# Patient Record
Sex: Female | Born: 1985 | Hispanic: Yes | Marital: Married | Smoking: Never smoker
Health system: Southern US, Community
[De-identification: ages and names within clinical notes are randomized; demographics above are authoritative.]

## PROBLEM LIST (undated history)

## (undated) DIAGNOSIS — Z789 Other specified health status: Secondary | ICD-10-CM

## (undated) HISTORY — PX: NO PAST SURGERIES: SHX2092

---

## 2008-12-08 ENCOUNTER — Emergency Department (HOSPITAL_COMMUNITY): Admission: EM | Admit: 2008-12-08 | Discharge: 2008-12-08 | Payer: Self-pay | Admitting: Family Medicine

## 2010-02-12 ENCOUNTER — Inpatient Hospital Stay (HOSPITAL_COMMUNITY)
Admission: AD | Admit: 2010-02-12 | Discharge: 2010-02-14 | DRG: 775 | Disposition: A | Discharge status: Home / Self Care | Payer: Medicaid Other | Source: Ambulatory Visit | Attending: Obstetrics & Gynecology | Admitting: Obstetrics & Gynecology

## 2010-02-12 DIAGNOSIS — O99892 Other specified diseases and conditions complicating childbirth: Secondary | ICD-10-CM | POA: Diagnosis present

## 2010-02-12 DIAGNOSIS — Z22330 Carrier of Group B streptococcus: Secondary | ICD-10-CM

## 2010-02-12 LAB — CBC
HCT: 36.4 % (ref 36.0–46.0)
Hemoglobin: 11.8 g/dL — ABNORMAL LOW (ref 12.0–15.0)
MCV: 84.8 fL (ref 78.0–100.0)
RBC: 4.29 MIL/uL (ref 3.87–5.11)
WBC: 10.4 10*3/uL (ref 4.0–10.5)

## 2010-02-13 LAB — CBC
Hemoglobin: 10.3 g/dL — ABNORMAL LOW (ref 12.0–15.0)
MCH: 27.5 pg (ref 26.0–34.0)
MCV: 84.8 fL (ref 78.0–100.0)
Platelets: 171 10*3/uL (ref 150–400)
RBC: 3.75 MIL/uL — ABNORMAL LOW (ref 3.87–5.11)
WBC: 13 10*3/uL — ABNORMAL HIGH (ref 4.0–10.5)

## 2010-04-10 LAB — POCT URINALYSIS DIP (DEVICE)
Hgb urine dipstick: NEGATIVE
Ketones, ur: NEGATIVE mg/dL
Protein, ur: NEGATIVE mg/dL
Specific Gravity, Urine: 1.01 (ref 1.005–1.030)
Urobilinogen, UA: 1 mg/dL (ref 0.0–1.0)

## 2010-04-10 LAB — POCT PREGNANCY, URINE: Preg Test, Ur: NEGATIVE

## 2011-03-07 ENCOUNTER — Emergency Department (HOSPITAL_COMMUNITY)
Admission: EM | Admit: 2011-03-07 | Discharge: 2011-03-07 | Disposition: A | Discharge status: Home / Self Care | Payer: Self-pay | Source: Home / Self Care | Attending: Family Medicine | Admitting: Family Medicine

## 2011-03-07 ENCOUNTER — Encounter (HOSPITAL_COMMUNITY): Payer: Self-pay | Admitting: *Deleted

## 2011-03-07 DIAGNOSIS — N61 Mastitis without abscess: Secondary | ICD-10-CM

## 2011-03-07 MED ORDER — CEPHALEXIN 500 MG PO CAPS: 500.0000 mg | ORAL_CAPSULE | Freq: Four times a day (QID) | ORAL | Status: AC

## 2011-03-07 NOTE — ED Provider Notes (Signed)
History     CSN: [id number]  Arrival date & time 03/07/11  1357   First MD Initiated Contact with Patient 03/07/11 1417      Chief Complaint  Patient presents with  . Fever    (Consider location/radiation/quality/duration/timing/severity/associated sxs/prior treatment) HPI Comments: The patient reports having left breast redness and pain along with fever x 2 days. She is breast feeding. Fever has been low grad at most. Denies cough or cold symptoms. No uti symptoms. No n/v/d. Has applied warm compresses with relief.   The history is provided by the patient.    History reviewed. No pertinent past medical history.  History reviewed. No pertinent past surgical history.  History reviewed. No pertinent family history.  History  Substance Use Topics  . Smoking status: Not on file  . Smokeless tobacco: Not on file  . Alcohol Use: Not on file    OB History    Grav Para Term Preterm Abortions TAB SAB Ect Mult Living                  Review of Systems  Constitutional: Positive for fever.  HENT: Negative.   Respiratory: Negative.   Cardiovascular: Negative.   Gastrointestinal: Negative.   Genitourinary: Negative.   Musculoskeletal: Negative.     Allergies  Review of patient's allergies indicates not on file.  Home Medications   Current Outpatient Rx  Name Route Sig Dispense Refill  . CEPHALEXIN 500 MG PO CAPS Oral Take 1 capsule (500 mg total) by mouth 4 (four) times daily. 28 capsule 0    BP 99/65  Pulse 70  Temp(Src) 99.1 F (37.3 C) (Oral)  Resp 16  SpO2 98%  LMP 03/05/2011  Physical Exam  Nursing note and vitals reviewed. Constitutional: She appears well-developed and well-nourished. No distress.  Cardiovascular: Normal rate and regular rhythm.   Pulmonary/Chest: Effort normal and breath sounds normal.       With female nursing personal stasha present exam of the the left breast revealed redness , increased warmth and tenderness superior left breast  approx 4 cm in diameter. No induration or flutulence.     ED Course  Procedures (including critical care time)  Labs Reviewed - No data to display No results found.   1. Mastitis       MDM          Randa Spike, MD 03/07/11 [phone]

## 2011-03-07 NOTE — ED Notes (Signed)
Checked on pt  .adjusted bed for comfort. Asked if needed anything to drink or a blanket. She stated no thank you just waiting for discharge papers

## 2011-03-07 NOTE — ED Notes (Signed)
Pt    Reports     Feve Body  Aches       Headache       l  Breast   Swollen     And      Painfull        Since     Yesterday  Pt  Is  Breast  Feeding

## 2011-03-07 NOTE — Discharge Instructions (Signed)
Continue to breast feed . Apply warm compresses as discussed. Follow up  If symptoms worsen. May take tylenol or motrin for fever.

## 2011-03-19 ENCOUNTER — Emergency Department (HOSPITAL_COMMUNITY)
Admission: EM | Admit: 2011-03-19 | Discharge: 2011-03-19 | Disposition: A | Discharge status: Home / Self Care | Payer: Self-pay | Source: Home / Self Care

## 2011-03-19 ENCOUNTER — Encounter (HOSPITAL_COMMUNITY): Payer: Self-pay | Admitting: Emergency Medicine

## 2011-03-19 DIAGNOSIS — L42 Pityriasis rosea: Secondary | ICD-10-CM

## 2011-03-19 NOTE — Discharge Instructions (Signed)
Pityriasis Rosea Pityriasis rosea is a rash which is probably caused by a virus. It generally starts as a scaly, red patch on the trunk (the area of the body that a t-shirt would cover) but does not appear on sun exposed areas. The rash is usually preceded by an initial larger spot called the "herald patch" a week or more before the rest of the rash appears. Generally within one to two days the rash appears rapidly on the trunk, upper arms, and sometimes the upper legs. The rash usually appears as flat, oval patches of scaly pink color. The rash can also be raised and one is able to feel it with a finger. The rash can also be finely crinkled and may slough off leaving a ring of scale around the spot. Sometimes a mild sore throat is present with the rash. It usually affects children and young adults in the spring and autumn. Women are more frequently affected than men. TREATMENT  Pityriasis rosea is a self-limited condition. This means it goes away within 4 to 8 weeks without treatment. The spots may persist for several months, especially in darker-colored skin after the rash has resolved and healed. Benadryl and steroid creams may be used if itching is a problem. SEEK MEDICAL CARE IF:   Your rash does not go away or persists longer than three months.   You develop fever and joint pain.   You develop severe headache and confusion.   You develop breathing difficulty, vomiting and/or extreme weakness.  Document Released: 01/30/2001 Document Revised: 12/13/2010 Document Reviewed: 02/19/2008 [facility] Patient Information 2012 [place], [place].Pityriasis Rosea Pityriasis rosea is a rash which is probably caused by a virus. It generally starts as a scaly, red patch on the trunk (the area of the body that a t-shirt would cover) but does not appear on sun exposed areas. The rash is usually preceded by an initial larger spot called the "herald patch" a week or more before the rest of the rash appears. Generally  within one to two days the rash appears rapidly on the trunk, upper arms, and sometimes the upper legs. The rash usually appears as flat, oval patches of scaly pink color. The rash can also be raised and one is able to feel it with a finger. The rash can also be finely crinkled and may slough off leaving a ring of scale around the spot. Sometimes a mild sore throat is present with the rash. It usually affects children and young adults in the spring and autumn. Women are more frequently affected than men. TREATMENT  Pityriasis rosea is a self-limited condition. This means it goes away within 4 to 8 weeks without treatment. The spots may persist for several months, especially in darker-colored skin after the rash has resolved and healed. Benadryl and steroid creams may be used if itching is a problem. SEEK MEDICAL CARE IF:   Your rash does not go away or persists longer than three months.   You develop fever and joint pain.   You develop severe headache and confusion.   You develop breathing difficulty, vomiting and/or extreme weakness.  Document Released: 01/30/2001 Document Revised: 12/13/2010 Document Reviewed: 02/19/2008 [facility] Patient Information 2012 [place], [place].

## 2011-03-19 NOTE — ED Provider Notes (Signed)
History     CSN: [id number]  Arrival date & time 03/19/11  1404   None     Chief Complaint  Patient presents with  . Rash    (Consider location/radiation/quality/duration/timing/severity/associated sxs/prior treatment) HPI Comments: Patient presents with c/o a rash. The rash began 4 days ago on her Rt forearm. That evening she noticed it also in her groin. It has spread since then. No itching or discomfort. She denies new foods, soaps, detergents, etc. She took 2 days of an antibiotic 2 weeks ago for mastitis Lt breast. She has fever and erythematous area of her breast. She states the symptoms resolved. No sneezing, watery eyes, or URI symptoms. No one else in the home with rash symptoms. She has not tried anything for the rash.    History reviewed. No pertinent past medical history.  History reviewed. No pertinent past surgical history.  History reviewed. No pertinent family history.  History  Substance Use Topics  . Smoking status: Not on file  . Smokeless tobacco: Not on file  . Alcohol Use: Not on file    OB History    Grav Para Term Preterm Abortions TAB SAB Ect Mult Living                  Review of Systems  Constitutional: Negative for fever, chills and fatigue.  HENT: Negative for ear pain, congestion, sore throat and rhinorrhea.   Respiratory: Negative for cough.   Cardiovascular: Negative for chest pain.  Skin: Positive for rash.    Allergies  Review of patient's allergies indicates no known allergies.  Home Medications  No current outpatient prescriptions on file.  BP 105/67  Pulse 82  Temp(Src) 97.4 F (36.3 C) (Oral)  Resp 16  SpO2 97%  LMP 03/05/2011  Physical Exam  Nursing note and vitals reviewed. Constitutional: She appears well-developed and well-nourished. No distress.  HENT:  Head: Normocephalic and atraumatic.  Right Ear: Tympanic membrane, external ear and ear canal normal.  Left Ear: Tympanic membrane, external ear and ear canal  normal.  Nose: Nose normal.  Mouth/Throat: Uvula is midline, oropharynx is clear and moist and mucous membranes are normal. No oropharyngeal exudate, posterior oropharyngeal edema or posterior oropharyngeal erythema.  Neck: Neck supple.  Cardiovascular: Normal rate, regular rhythm and normal heart sounds.   Pulmonary/Chest: Effort normal and breath sounds normal. No respiratory distress.  Lymphadenopathy:    She has no cervical adenopathy.  Neurological: She is alert.  Skin: Skin is warm and dry. Rash noted.       Scattered on trunk and bilat UE erythematous papules and oval shaped salmon colored lesions all < 1 cm in diameter. Some with greyish centers and some are dry and centrally peeling. No vesicles or pustules. Pt has increased lesions in groin and all of these lesions are centrally peeling.   Psychiatric: She has a normal mood and affect.    ED Course  Procedures (including critical care time)  Labs Reviewed - No data to display No results found.   1. Pityriasis rosea       MDM  Typical appearance of Pityriasis rosea, though no heard patch.         Melody Comas, [place] 03/19/11 [phone]

## 2011-03-19 NOTE — ED Notes (Signed)
Patient reports red areas, the size of an " eraser tip" on trunk, extremities.  Scattered, non specific pattern to rash.  Areas do not itch, do not hurt.  Areas do continue to spread

## 2011-03-20 NOTE — ED Provider Notes (Signed)
Medical screening examination/treatment/procedure(s) were performed by non-physician practitioner and as supervising physician I was immediately available for consultation/collaboration.  LANEY,RONNIE   Aaiden Depoy B Laney, MD 03/20/11 1630 

## 2012-03-27 ENCOUNTER — Encounter (HOSPITAL_COMMUNITY): Payer: Self-pay

## 2012-03-27 ENCOUNTER — Emergency Department (HOSPITAL_COMMUNITY)
Admission: EM | Admit: 2012-03-27 | Discharge: 2012-03-27 | Disposition: A | Discharge status: Home / Self Care | Payer: Self-pay | Attending: Emergency Medicine | Admitting: Emergency Medicine

## 2012-03-27 DIAGNOSIS — Z23 Encounter for immunization: Secondary | ICD-10-CM | POA: Insufficient documentation

## 2012-03-27 DIAGNOSIS — IMO0002 Reserved for concepts with insufficient information to code with codable children: Secondary | ICD-10-CM | POA: Insufficient documentation

## 2012-03-27 DIAGNOSIS — S8010XA Contusion of unspecified lower leg, initial encounter: Secondary | ICD-10-CM | POA: Insufficient documentation

## 2012-03-27 DIAGNOSIS — Y929 Unspecified place or not applicable: Secondary | ICD-10-CM | POA: Insufficient documentation

## 2012-03-27 DIAGNOSIS — W540XXA Bitten by dog, initial encounter: Secondary | ICD-10-CM | POA: Insufficient documentation

## 2012-03-27 DIAGNOSIS — S81852A Open bite, left lower leg, initial encounter: Secondary | ICD-10-CM

## 2012-03-27 DIAGNOSIS — Y9389 Activity, other specified: Secondary | ICD-10-CM | POA: Insufficient documentation

## 2012-03-27 DIAGNOSIS — R51 Headache: Secondary | ICD-10-CM

## 2012-03-27 DIAGNOSIS — M7989 Other specified soft tissue disorders: Secondary | ICD-10-CM | POA: Insufficient documentation

## 2012-03-27 DIAGNOSIS — R42 Dizziness and giddiness: Secondary | ICD-10-CM | POA: Insufficient documentation

## 2012-03-27 MED ORDER — AMOXICILLIN-POT CLAVULANATE 875-125 MG PO TABS: 1.0000 | ORAL_TABLET | Freq: Two times a day (BID) | ORAL | Status: DC

## 2012-03-27 MED ORDER — TETANUS-DIPHTH-ACELL PERTUSSIS 5-2.5-18.5 LF-MCG/0.5 IM SUSP
0.5000 mL | Freq: Once | INTRAMUSCULAR | Status: AC
  Administered 2012-03-27: 0.5 mL via INTRAMUSCULAR
  Filled 2012-03-27: qty 0.5

## 2012-03-27 NOTE — ED Notes (Signed)
Patient is alert and orientedx4.  Patient was explained discharge instructions and they understood them with no questions.  Instructions were provided in Spanish.

## 2012-03-27 NOTE — ED Notes (Signed)
Pt presents with increased pain to L lower leg after dog bite x 4 days ago.  Pt reports 3 puncture wounds to L calf, reports warmth to area, denies any drainage.  Pt reports onset of headache that began today to bilateral temporal area.  Pt reports taking tylenol that helped relief headache.  +nausea

## 2012-03-27 NOTE — ED Provider Notes (Signed)
Medical screening examination/treatment/procedure(s) were performed by non-physician practitioner and as supervising physician I was immediately available for consultation/collaboration.   Nelia Shi, MD 03/27/12 [phone]

## 2012-03-27 NOTE — ED Provider Notes (Signed)
History    This chart was scribed for non-physician practitioner working with Nelia Shi, MD by Donne Anon, ED Scribe. This patient was seen in room TR05C/TR05C and the patient's care was started at 1511.   CSN: [id number]  Arrival date & time 03/27/12  1311   First MD Initiated Contact with Patient 03/27/12 1511      No chief complaint on file.    The history is provided by the patient. No language interpreter was used.   Jeanne Munoz is a 27 y.o. female who presents to the Emergency Department complaining of sudden onset, constant, gradually worsening moderate lower left leg wound due to a dog bite which occurred 4 days ago. She denies seeing anyone for the wound. She reports it was her neighbor's dog, but she is unsure if it is UTD on its shots. She has put an antibiotic cream on the wound. She reports associated swelling and heat to her shin. She also reports associated gradual onset, constant, mild HA. She denies fever, chills, or any other pain. She has tried Advil and Tylenol with little relief.     She is unsure if her tetanus shot is UTD.  History reviewed. No pertinent past medical history.  History reviewed. No pertinent past surgical history.  History reviewed. No pertinent family history.  History  Substance Use Topics  . Smoking status: Never Smoker   . Smokeless tobacco: Not on file  . Alcohol Use: No    OB History   Grav Para Term Preterm Abortions TAB SAB Ect Mult Living                  Review of Systems  Constitutional: Negative for fever and chills.  Skin: Positive for wound.  Neurological: Positive for headaches.  All other systems reviewed and are negative.    Allergies  Review of patient's allergies indicates no known allergies.  Home Medications   Current Outpatient Rx  Name  Route  Sig  Dispense  Refill  . acetaminophen (TYLENOL) 500 MG tablet   Oral   Take 1,000 mg by mouth every 6 (six) hours as needed for pain.          Marland Kitchen ibuprofen (ADVIL,MOTRIN) 200 MG tablet   Oral   Take 200 mg by mouth every 6 (six) hours as needed for pain or headache.           BP 110/66  Pulse 77  Temp(Src) 98.9 F (37.2 C) (Oral)  Resp 18  SpO2 99%  LMP 03/02/2012  Physical Exam  Nursing note and vitals reviewed. Constitutional: She is oriented to person, place, and time. She appears well-developed and well-nourished. No distress.  HENT:  Head: Normocephalic and atraumatic.  Eyes: EOM are normal.  Neck: Neck supple. No tracheal deviation present.  Cardiovascular: Normal rate and intact distal pulses.   Pulmonary/Chest: Effort normal. No respiratory distress.  Musculoskeletal: Normal range of motion.  Neurological: She is alert and oriented to person, place, and time.  Skin: Skin is warm and dry.  10 cm x 8 cm contusion to outside left lower lateral shin. Superficial healing abrasion to the leg. Induration under the abrasions. No erythema. Area is tender to palpation. Distal pulses normal. No signs of compartment syndrome. Full rom of the knee and ankle joints. Good strength with dorsiflexion nd plantarflexion of the foot.   Psychiatric: She has a normal mood and affect. Her behavior is normal.    ED Course  Procedures (including critical care  time) DIAGNOSTIC STUDIES: Oxygen Saturation is 99% on room air, normal by my interpretation.    COORDINATION OF CARE: 3:20 PM Discussed treatment plan which includes antibiotics, rest and elevation of her leg with pt at bedside and pt agreed to plan. Also advised pt to check with owner if dog's shots are UTD.    Labs Reviewed - No data to display No results found.   1. Dog bite of lower leg, left, initial encounter   2. Headache       MDM  Pt with dog bite 4 days ago. Healing. Some tenderness and induration over the bite. Contusiont. Will start on augmentin. Will need to report to animal control and make sure dog's rabies is up to date. Tetanus updated here. At  present no signs of major cellulitis or soft tissue abscess. No compartment syndrome.    I personally performed the services described in this documentation, which was scribed in my presence. The recorded information has been reviewed and is accurate.     RENE GONSOULIN, PA-C 03/27/12 1535  CARLEA BADOUR, PA-C 03/27/12 1551

## 2013-04-01 ENCOUNTER — Emergency Department (INDEPENDENT_AMBULATORY_CARE_PROVIDER_SITE_OTHER)
Admission: EM | Admit: 2013-04-01 | Discharge: 2013-04-01 | Disposition: A | Discharge status: Home / Self Care | Payer: Self-pay | Source: Home / Self Care | Attending: Family Medicine | Admitting: Family Medicine

## 2013-04-01 ENCOUNTER — Encounter (HOSPITAL_COMMUNITY): Payer: Self-pay | Admitting: Emergency Medicine

## 2013-04-01 DIAGNOSIS — M6740 Ganglion, unspecified site: Secondary | ICD-10-CM

## 2013-04-01 NOTE — ED Provider Notes (Signed)
Medical screening examination/treatment/procedure(s) were performed by a resident physician or non-physician practitioner and as the supervising physician I was immediately available for consultation/collaboration.  Evan Corey, MD    Evan S Corey, MD 04/01/13 1812 

## 2013-04-01 NOTE — ED Provider Notes (Signed)
CSN: [id number]     Arrival date & time 04/01/13  1446 History   First MD Initiated Contact with Patient 04/01/13 1704     Chief Complaint  Patient presents with  . Cyst   (Consider location/radiation/quality/duration/timing/severity/associated sxs/prior Treatment) HPI Comments: Minimal pain unless wearing shoes that press on knot on top of foot.   Patient is a 28 y.o. female presenting with lower extremity pain. The history is provided by the patient.  Foot Pain This is a new problem. Episode onset: 3 weeks ago. The problem occurs constantly. The problem has not changed since onset.Exacerbated by: wearing shoes. Nothing relieves the symptoms. She has tried nothing for the symptoms.    History reviewed. No pertinent past medical history. History reviewed. No pertinent past surgical history. History reviewed. No pertinent family history. History  Substance Use Topics  . Smoking status: Never Smoker   . Smokeless tobacco: Not on file  . Alcohol Use: No   OB History   Grav Para Term Preterm Abortions TAB SAB Ect Mult Living                 Review of Systems  Constitutional: Negative for fever and chills.  Musculoskeletal:       Knot on top of foot  Skin: Negative for color change and wound.  Neurological: Negative for numbness.    Allergies  Review of patient's allergies indicates no known allergies.  Home Medications   Current Outpatient Rx  Name  Route  Sig  Dispense  Refill  . acetaminophen (TYLENOL) 500 MG tablet   Oral   Take 1,000 mg by mouth every 6 (six) hours as needed for pain.         Marland Kitchen. amoxicillin-clavulanate (AUGMENTIN) 875-125 MG per tablet   Oral   Take 1 tablet by mouth 2 (two) times daily.   10 tablet   0   . ibuprofen (ADVIL,MOTRIN) 200 MG tablet   Oral   Take 200 mg by mouth every 6 (six) hours as needed for pain or headache.          BP 109/61  Pulse 63  Temp(Src) 98 F (36.7 C) (Oral)  Resp 12  SpO2 100% Physical Exam    Constitutional: She appears well-developed and well-nourished. No distress.  Musculoskeletal:       Right foot: She exhibits normal range of motion, no tenderness and no bony tenderness.       Feet:    ED Course  Procedures (including critical care time) Labs Review Labs Reviewed - No data to display Imaging Review No results found.   MDM   1. Ganglion cyst   Dr. Denyse Amassorey discussed tx options with pt and offered injection. Pt declines at this time but knows she can return in the future on a day Dr. Denyse Amassorey works for injection.      Cathlyn ParsonsAngela M Herald Vallin, NP 04/01/13 [phone]

## 2013-04-01 NOTE — Discharge Instructions (Signed)
If you decide to have the injection, come back to Urgent Care on a day Dr. Denyse Amassorey is working.  April 14 from 5-8pm April 16 from 8am-8pm April 17 from 8am-8pm April 18 from 9am-7pm  It is best to come in the morning.    Ganglion Cyst A ganglion cyst is a noncancerous, fluid-filled lump that occurs near joints or tendons. The ganglion cyst grows out of a joint or the lining of a tendon. It most often develops in the hand or wrist but can also develop in the shoulder, elbow, hip, knee, ankle, or foot. The round or oval ganglion can be pea sized or larger than a grape. Increased activity may enlarge the size of the cyst because more fluid starts to build up.  CAUSES  It is not completely known what causes a ganglion cyst to grow. However, it may be related to:  Inflammation or irritation around the joint.  An injury.  Repetitive movements or overuse.  Arthritis. SYMPTOMS  A lump most often appears in the hand or wrist, but can occur in other areas of the body. Generally, the lump is painless without other symptoms. However, sometimes pain can be felt during activity or when pressure is applied to the lump. The lump may even be tender to the touch. Tingling, pain, numbness, or muscle weakness can occur if the ganglion cyst presses on a nerve. Your grip may be weak and you may have less movement in your joints.  DIAGNOSIS  Ganglion cysts are most often diagnosed based on a physical exam, noting where the cyst is and how it looks. Your caregiver will feel the lump and may shine a light alongside it. If it is a ganglion, a light often shines through it. Your caregiver may order an X-ray, ultrasound, or MRI to rule out other conditions. TREATMENT  Ganglions usually go away on their own without treatment. If pain or other symptoms are involved, treatment may be needed. Treatment is also needed if the ganglion limits your movement or if it gets infected. Treatment options include:  Wearing a wrist  or finger brace or splint.  Taking anti-inflammatory medicine.  Draining fluid from the lump with a needle (aspiration).  Injecting a steroid into the joint.  Surgery to remove the ganglion cyst and its stalk that is attached to the joint or tendon. However, ganglion cysts can grow back. HOME CARE INSTRUCTIONS   Do not press on the ganglion, poke it with a needle, or hit it with a heavy object. You may rub the lump gently and often. Sometimes fluid moves out of the cyst.  Only take medicines as directed by your caregiver.  Wear your brace or splint as directed by your caregiver. SEEK MEDICAL CARE IF:   Your ganglion becomes larger or more painful.  You have increased redness, red streaks, or swelling.  You have pus coming from the lump.  You have weakness or numbness in the affected area. MAKE SURE YOU:   Understand these instructions.  Will watch your condition.  Will get help right away if you are not doing well or get worse. Document Released: 12/22/1999 Document Revised: 09/18/2011 Document Reviewed: 02/17/2007 [facility] Patient Information 2014 [employer], [place].

## 2013-04-01 NOTE — ED Notes (Signed)
Knot on the top of right foot.  Knot is firm and some tenderness involved with palpation.  Otherwise does not hurt.  Noted knot 3 weeks ago, no smaller, no bigger.  No known injury.

## 2013-05-07 ENCOUNTER — Emergency Department (INDEPENDENT_AMBULATORY_CARE_PROVIDER_SITE_OTHER)
Admission: EM | Admit: 2013-05-07 | Discharge: 2013-05-07 | Disposition: A | Discharge status: Home / Self Care | Payer: Self-pay | Source: Home / Self Care | Attending: Family Medicine | Admitting: Family Medicine

## 2013-05-07 ENCOUNTER — Encounter (HOSPITAL_COMMUNITY): Payer: Self-pay | Admitting: Emergency Medicine

## 2013-05-07 DIAGNOSIS — M6740 Ganglion, unspecified site: Secondary | ICD-10-CM

## 2013-05-07 MED ORDER — METHYLPREDNISOLONE ACETATE 40 MG/ML IJ SUSP
INTRAMUSCULAR | Status: AC
  Filled 2013-05-07: qty 5

## 2013-05-07 NOTE — Discharge Instructions (Signed)
Gracias por venir hoy.  Llame o vaya a la sala de emergencias si se presenta una gran articulacin hinchada roja con extremo dolor o supuracin de pus .  Quiste sinovial  (Ganglion Cyst)  Un quiste sinovial o ganglin es un bulto no canceroso, lleno de lquido que aparece cerca de las articulaciones o tendones. El quiste sinovial aparece sobre una articulacin o en el revestimiento de un tendn. Aparece con ms frecuencia en la mano o en la [place], pero tambin puede aparecer en el hombro, el codo, la cadera, la rodilla, el tobillo o el pie. El ganglin redondo u ovalado puede ser del tamao de un guisante o ms grande que un grano de [place]. El aumento de la actividad puede aumentar el tamao del quiste ya que se acumular ms lquido.  CAUSAS  No se conoce la causa de la formacin de un quiste sinovial. Sin embargo, puede estar relacionado con:   Inflamacin o irritacin alrededor de la articulacin.  Una lesin.  Movimientos repetitivos o el uso excesivo.  Artritis. SNTOMAS  Generalmente es un bulto que aparece en la mano o en la [place], [place] puede aparecer en otras zonas del cuerpo. Se trata de una masa indolora y sin otros sntomas. En algunos casos puede haber dolor durante la [place] o cuando se aplica presin [place] el quiste. Puede ser sensible al tacto. Puede causar hormigueo, dolor, entumecimiento o debilidad muscular si el ganglin presiona un nervio. Su agarre puede ser dbil y puede ser que tenga menos movimiento en la articulacin.  DIAGNSTICO  El diagnstico se realiza con el examen fsico, percibiendo donde se encuentra el quiste y su aspecto. El mdico palpar el bulto y lo estudiar aplicando una luz. Si se trata de un ganglin, la luz lo Engineer, agriculturalatravesar. El Chemical engineermdico le indicar radiografas, ecografas o una resonancia magntica para Architectural technologistdescartar otras patologas.  TRATAMIENTO  Generalmente desaparecen por s solos, sin tratamiento. Si siente dolor o tiene otros sntomas, puede ser  Firefighternecesario un tratamiento. Tambin ser necesario seguir un tratamiento si limita sus movimientos o si se infecta. Las opciones de tratamiento son:   El uso de un cabestrillo o frula en la mueca o los dedos.  Medicamentos antiinflamatorios.  Drenar el lquido con una aguja (aspiracin).  Inyeccin de corticoides en la articulacin.  Ciruga para extirpar el ganglio y el tallo que lo adhiere a la articulacin o al tendn. Sin embargo, los quistes sinoviales pueden volver a Research officer, trade unionaparecer. INSTRUCCIONES PARA EL CUIDADO EN EL HOGAR   No presione el ganglin, no lo pinche con una aguja ni lo golpee con un objeto pesado. Puede frotarlo suavemente y con frecuencia. En algunos casos podr salir lquido del quiste.  Tome slo la medicacin que le indic el profesional.  Use la frula o el cabestrillo segn las indicaciones de su mdico. SOLICITE ATENCIN MDICA SI:   El ganglin se agranda o se vuelve ms doloroso.  El enrojecimiento, la hinchazn o las lneas rojas [place]oaaumentan.  Observa que sale pus del bulto.  Siente debilidad o adormecimiento alrededor de la zona afectada. ASEGRESE DE QUE:   Comprende estas instrucciones.  Controlar su enfermedad.  Solicitar ayuda de inmediato si no mejora o si empeora. Document Released: 10/03/2004 Document Revised: 09/18/2011 [facility] [employer] Patient Information 2014 [place], [place].

## 2013-05-07 NOTE — ED Notes (Addendum)
Pt is here for a f/u on ganglion cyst on right foot Reports mild d/c; 5/10 States it now will feel occasional burning Alert w/no signs of acute distress.

## 2013-05-07 NOTE — ED Provider Notes (Signed)
Jeanne Munoz is a 28 y.o. female who presents to Urgent Care today for right dorsal foot ganglion cyst. Present for several weeks. Patient was seen about one month ago for dorsal foot ganglion cyst. Her symptoms have continued despite conservative management. She would like injection and aspiration of possible.    History reviewed. No pertinent past medical history. History  Substance Use Topics  . Smoking status: Never Smoker   . Smokeless tobacco: Not on file  . Alcohol Use: No   ROS as above Medications: No current facility-administered medications for this encounter.   Current Outpatient Prescriptions  Medication Sig Dispense Refill  . acetaminophen (TYLENOL) 500 MG tablet Take 1,000 mg by mouth every 6 (six) hours as needed for pain.      Marland Kitchen. amoxicillin-clavulanate (AUGMENTIN) 875-125 MG per tablet Take 1 tablet by mouth 2 (two) times daily.  10 tablet  0  . ibuprofen (ADVIL,MOTRIN) 200 MG tablet Take 200 mg by mouth every 6 (six) hours as needed for pain or headache.        Exam:  BP 113/45  Pulse 62  Temp(Src) 98 F (36.7 C) (Oral)  Resp 16  SpO2 100%  LMP 05/06/2013 Gen: Well NAD  right foot: Small dorsal midfoot ganglion cyst. Normal-appearing skin.  Procedure note:  Consent obtained and timeout performed. Consent using a Spanish interpreter. Discussed risks including infection and skin bleaching. Skin cleaned with alcohol and a half a millimeter of lidocaine was injected superficially to the ganglion cyst.  The skin was cleaned with Betadine.  An 18-gauge needle was used to access the cyst. A small amount of gelatinous material was expressed.  5 mg of triamcinolone and a very small amount of lidocaine were injected into the cyst area.   Patient tolerated the procedure well  No results found for this or any previous visit (from the past 24 hour(s)). No results found.  Assessment and Plan: 28 y.o. female with right dorsal foot ganglion cyst. Status post  aspiration and injection today. Return as needed  Discussed warning signs or symptoms. Please see discharge instructions. Patient expresses understanding.    Rodolph BongEvan S Zamani Crocker, MD 05/07/13 [phone]

## 2013-09-01 ENCOUNTER — Emergency Department (HOSPITAL_COMMUNITY)
Admission: EM | Admit: 2013-09-01 | Discharge: 2013-09-01 | Disposition: A | Discharge status: Home / Self Care | Payer: Self-pay | Attending: Family Medicine | Admitting: Family Medicine

## 2013-09-01 ENCOUNTER — Encounter (HOSPITAL_COMMUNITY): Payer: Self-pay | Admitting: Emergency Medicine

## 2013-09-01 DIAGNOSIS — Z791 Long term (current) use of non-steroidal anti-inflammatories (NSAID): Secondary | ICD-10-CM | POA: Insufficient documentation

## 2013-09-01 DIAGNOSIS — G609 Hereditary and idiopathic neuropathy, unspecified: Secondary | ICD-10-CM | POA: Insufficient documentation

## 2013-09-01 DIAGNOSIS — M79609 Pain in unspecified limb: Secondary | ICD-10-CM | POA: Insufficient documentation

## 2013-09-01 DIAGNOSIS — Z792 Long term (current) use of antibiotics: Secondary | ICD-10-CM | POA: Insufficient documentation

## 2013-09-01 DIAGNOSIS — G629 Polyneuropathy, unspecified: Secondary | ICD-10-CM

## 2013-09-01 MED ORDER — GABAPENTIN 300 MG PO CAPS: 300.0000 mg | ORAL_CAPSULE | Freq: Three times a day (TID) | ORAL | Status: DC

## 2013-09-01 NOTE — ED Notes (Signed)
Per pt sts pain and numbness to right shin area for 3 days,. sts she has been having this problem off and on for 3 years.

## 2013-09-01 NOTE — Discharge Instructions (Signed)

## 2013-09-01 NOTE — ED Provider Notes (Signed)
CSN: [id number]     Arrival date & time 09/01/13  1332 History   First MD Initiated Contact with Patient 09/01/13 1443     Chief Complaint  Patient presents with  . Leg Pain     (Consider location/radiation/quality/duration/timing/severity/associated sxs/prior Treatment) HPI Comments: 28 year old female presents complaining of pain and numbness in her bilateral lower extremities. This started 5 days ago and has gotten much worse over the past 3 days. This pain is worse with any ambulation and is somewhat relieved by rest. It started intermittent shins and has since progressed down to her feet. She says the pain is much worse than the numbness. She has never had this before. No systemic symptoms and no recent illness.  Patient is a 28 y.o. female presenting with leg pain.  Leg Pain   History reviewed. No pertinent past medical history. History reviewed. No pertinent past surgical history. History reviewed. No pertinent family history. History  Substance Use Topics  . Smoking status: Never Smoker   . Smokeless tobacco: Not on file  . Alcohol Use: No   OB History   Grav Para Term Preterm Abortions TAB SAB Ect Mult Living                 Review of Systems  Musculoskeletal:       And leg pain  Neurological: Positive for numbness.  All other systems reviewed and are negative.     Allergies  Review of patient's allergies indicates no known allergies.  Home Medications   Prior to Admission medications   Medication Sig Start Date End Date Taking? Authorizing Provider  acetaminophen (TYLENOL) 500 MG tablet Take 1,000 mg by mouth every 6 (six) hours as needed for pain.    Historical Provider, MD  amoxicillin-clavulanate (AUGMENTIN) 875-125 MG per tablet Take 1 tablet by mouth 2 (two) times daily. 03/27/12   Tatyana A Kirichenko, PA-C  gabapentin (NEURONTIN) 300 MG capsule Take 1 capsule (300 mg total) by mouth 3 (three) times daily. 09/01/13   Graylon Good, PA-C  ibuprofen  (ADVIL,MOTRIN) 200 MG tablet Take 200 mg by mouth every 6 (six) hours as needed for pain or headache.    Historical Provider, MD   BP 114/70  Pulse 61  Temp(Src) 97.9 F (36.6 C) (Oral)  Resp 22  Ht  (1.6 m)  Wt 140 lb (63.504 kg)  BMI 24.81 kg/m2  SpO2 100% Physical Exam  Nursing note and vitals reviewed. Constitutional: She is oriented to person, place, and time. Vital signs are normal. She appears well-developed and well-nourished. No distress.  HENT:  Head: Normocephalic and atraumatic.  Cardiovascular:  Pulses:      Dorsalis pedis pulses are 2+ on the right side, and 2+ on the left side.       Posterior tibial pulses are 2+ on the right side, and 2+ on the left side.  Pulmonary/Chest: Effort normal. No respiratory distress.  Musculoskeletal:  Musculoskeletal exam of the bilateral lower extremities is entirely normal  Neurological: She is alert and oriented to person, place, and time. She has normal strength and normal reflexes. No sensory deficit. She exhibits normal muscle tone. Coordination and gait normal.  Skin: Skin is warm and dry. No rash noted. She is not diaphoretic.  Psychiatric: She has a normal mood and affect. Judgment normal.    ED Course  Procedures (including critical care time) Labs Review Labs Reviewed - No data to display  Imaging Review No results found.   EKG Interpretation  None      MDM   Final diagnoses:  Peripheral neuropathy    History most consistent with a neuropathy, not ascending. Claudication is also in the differential but she has no reason to have claudication. Case discussed with attending, We'll try treating with Neurontin and she will followup as needed.   Meds ordered this encounter  Medications  . gabapentin (NEURONTIN) 300 MG capsule    Sig: Take 1 capsule (300 mg total) by mouth 3 (three) times daily.    Dispense:  30 capsule    Refill:  0    Order Specific Question:  Supervising Provider    Answer:  MERRELL,  DAVID J [[id number]]       Graylon Good, PA-C 09/01/13 1515

## 2013-09-02 ENCOUNTER — Emergency Department (HOSPITAL_COMMUNITY)
Admission: EM | Admit: 2013-09-02 | Discharge: 2013-09-02 | Disposition: A | Discharge status: Home / Self Care | Payer: Self-pay | Attending: Emergency Medicine | Admitting: Emergency Medicine

## 2013-09-02 ENCOUNTER — Encounter (HOSPITAL_COMMUNITY): Payer: Self-pay | Admitting: Emergency Medicine

## 2013-09-02 DIAGNOSIS — IMO0002 Reserved for concepts with insufficient information to code with codable children: Secondary | ICD-10-CM | POA: Insufficient documentation

## 2013-09-02 DIAGNOSIS — M79609 Pain in unspecified limb: Secondary | ICD-10-CM | POA: Insufficient documentation

## 2013-09-02 DIAGNOSIS — Z79899 Other long term (current) drug therapy: Secondary | ICD-10-CM | POA: Insufficient documentation

## 2013-09-02 DIAGNOSIS — M792 Neuralgia and neuritis, unspecified: Secondary | ICD-10-CM

## 2013-09-02 LAB — CBC WITH DIFFERENTIAL/PLATELET
BASOS ABS: 0 10*3/uL (ref 0.0–0.1)
BASOS PCT: 0 % (ref 0–1)
EOS ABS: 0 10*3/uL (ref 0.0–0.7)
Eosinophils Relative: 1 % (ref 0–5)
HCT: 40.3 % (ref 36.0–46.0)
Hemoglobin: 13.7 g/dL (ref 12.0–15.0)
LYMPHS ABS: 1.4 10*3/uL (ref 0.7–4.0)
Lymphocytes Relative: 30 % (ref 12–46)
MCH: 29.5 pg (ref 26.0–34.0)
MCHC: 34 g/dL (ref 30.0–36.0)
MCV: 86.9 fL (ref 78.0–100.0)
Monocytes Absolute: 0.5 10*3/uL (ref 0.1–1.0)
Monocytes Relative: 10 % (ref 3–12)
NEUTROS PCT: 59 % (ref 43–77)
Neutro Abs: 2.8 10*3/uL (ref 1.7–7.7)
PLATELETS: 168 10*3/uL (ref 150–400)
RBC: 4.64 MIL/uL (ref 3.87–5.11)
RDW: 13.3 % (ref 11.5–15.5)
WBC: 4.7 10*3/uL (ref 4.0–10.5)

## 2013-09-02 LAB — BASIC METABOLIC PANEL
Anion gap: 15 (ref 5–15)
BUN: 13 mg/dL (ref 6–23)
CO2: 23 mEq/L (ref 19–32)
Calcium: 9.7 mg/dL (ref 8.4–10.5)
Chloride: 100 mEq/L (ref 96–112)
Creatinine, Ser: 0.69 mg/dL (ref 0.50–1.10)
GFR calc Af Amer: >90 mL/min (ref >90)
GFR calc non Af Amer: >90 mL/min (ref >90)
Glucose, Bld: 92 mg/dL (ref 70–99)
Potassium: 4.1 mEq/L (ref 3.7–5.3)
Sodium: 138 mEq/L (ref 137–147)

## 2013-09-02 LAB — MAGNESIUM: Magnesium: 2.2 mg/dL (ref 1.5–2.5)

## 2013-09-02 NOTE — ED Provider Notes (Signed)
CSN: [id number]     Arrival date & time 09/02/13  1337 History   First MD Initiated Contact with Patient 09/02/13 1555     Chief Complaint  Patient presents with  . Leg Pain     (Consider location/radiation/quality/duration/timing/severity/associated sxs/prior Treatment) Patient is a 28 y.o. female presenting with leg pain. The history is provided by the patient.  Leg Pain Location:  Leg Time since incident:  6 days Leg location:  L upper leg, R upper leg, R lower leg and L lower leg Pain details:    Quality:  Burning   Radiates to:  Does not radiate   Severity:  Moderate   Onset quality:  Gradual   Timing:  Constant   Progression:  Worsening Chronicity:  New Dislocation: no   Prior injury to area:  No Relieved by:  Rest Exacerbated by: walking around. Ineffective treatments:  None tried Associated symptoms: no fever     History reviewed. No pertinent past medical history. History reviewed. No pertinent past surgical history. History reviewed. No pertinent family history. History  Substance Use Topics  . Smoking status: Never Smoker   . Smokeless tobacco: Not on file  . Alcohol Use: No   OB History   Grav Para Term Preterm Abortions TAB SAB Ect Mult Living                 Review of Systems  Constitutional: Negative for fever.  Respiratory: Negative for cough and shortness of breath.   All other systems reviewed and are negative.     Allergies  Review of patient's allergies indicates no known allergies.  Home Medications   Prior to Admission medications   Medication Sig Start Date End Date Taking? Authorizing Provider  gabapentin (NEURONTIN) 300 MG capsule Take 1 capsule (300 mg total) by mouth 3 (three) times daily. 09/01/13  Yes Zachary H Baker, PA-C   BP 118/78  Pulse 67  Temp(Src) 98.5 F (36.9 C) (Oral)  Resp 12  SpO2 100% Physical Exam  Nursing note and vitals reviewed. Constitutional: She is oriented to person, place, and time. She appears  well-developed and well-nourished. No distress.  HENT:  Head: Normocephalic and atraumatic.  Mouth/Throat: Oropharynx is clear and moist.  Eyes: EOM are normal. Pupils are equal, round, and reactive to light.  Neck: Normal range of motion. Neck supple.  Cardiovascular: Normal rate and regular rhythm.  Exam reveals no friction rub.   No murmur heard. Pulmonary/Chest: Effort normal and breath sounds normal. No respiratory distress. She has no wheezes. She has no rales.  Abdominal: Soft. She exhibits no distension. There is no tenderness. There is no rebound.  Musculoskeletal: Normal range of motion. She exhibits no edema.  Neurological: She is alert and oriented to person, place, and time. No cranial nerve deficit. She exhibits normal muscle tone. Coordination normal.  Skin: No rash noted. She is not diaphoretic.    ED Course  Procedures (including critical care time) Labs Review Labs Reviewed  CBC WITH DIFFERENTIAL  MAGNESIUM  BASIC METABOLIC PANEL    Imaging Review No results found.   EKG Interpretation None      MDM   Final diagnoses:  Neuropathic pain    37F here with burning sensation in her bilateral legs. Put on gabapentin yesterday, however unable to get relief. Labs ok, all lytes ok. Stable for discharge, given resource guide for f/u.    Elwin Mocha, MD 09/03/13 [phone]

## 2013-09-02 NOTE — Discharge Instructions (Signed)
Neuropathic Pain We often think that pain has a physical cause. If we get rid of the cause, the pain should go away. Nerves themselves can also cause pain. It is called neuropathic pain, which means nerve abnormality. It may be difficult for the patients who have it and for the treating caregivers. Pain is usually described as acute (short-lived) or chronic (long-lasting). Acute pain is related to the physical sensations caused by an injury. It can last from a few seconds to many weeks, but it usually goes away when normal healing occurs. Chronic pain lasts beyond the typical healing time. With neuropathic pain, the nerve fibers themselves may be damaged or injured. They then send incorrect signals to other pain centers. The pain you feel is real, but the cause is not easy to find.  CAUSES  Chronic pain can result from diseases, such as diabetes and shingles (an infection related to chickenpox), or from trauma, surgery, or amputation. It can also happen without any known injury or disease. The nerves are sending pain messages, even though there is no identifiable cause for such messages.   Other common causes of neuropathy include diabetes, phantom limb pain, or Regional Pain Syndrome (RPS).  As with all forms of chronic back pain, if neuropathy is not correctly treated, there can be a number of associated problems that lead to a downward cycle for the patient. These include depression, sleeplessness, feelings of fear and anxiety, limited social interaction and inability to do normal daily activities or work.  The most dramatic and mysterious example of neuropathic pain is called "phantom limb syndrome." This occurs when an arm or a leg has been removed because of illness or injury. The brain still gets pain messages from the nerves that originally carried impulses from the missing limb. These nerves now seem to misfire and cause troubling pain.  Neuropathic pain often seems to have no cause. It responds  poorly to standard pain treatment. Neuropathic pain can occur after:  Shingles (herpes zoster virus infection).  A lasting burning sensation of the skin, caused usually by injury to a peripheral nerve.  Peripheral neuropathy which is widespread nerve damage, often caused by diabetes or alcoholism.  Phantom limb pain following an amputation.  Facial nerve problems (trigeminal neuralgia).  Multiple sclerosis.  Reflex sympathetic dystrophy.  Pain which comes with cancer and cancer chemotherapy.  Entrapment neuropathy such as when pressure is put on a nerve such as in carpal tunnel syndrome.  Back, leg, and hip problems (sciatica).  Spine or back surgery.  HIV Infection or AIDS where nerves are infected by viruses. Your caregiver can explain items in the above list which may apply to you. SYMPTOMS  Characteristics of neuropathic pain are:  Severe, sharp, electric shock-like, shooting, lightening-like, knife-like.  Pins and needles sensation.  Deep burning, deep cold, or deep ache.  Persistent numbness, tingling, or weakness.  Pain resulting from light touch or other stimulus that would not usually cause pain.  Increased sensitivity to something that would normally cause pain, such as a pinprick. Pain may persist for months or years following the healing of damaged tissues. When this happens, pain signals no longer sound an alarm about current injuries or injuries about to happen. Instead, the alarm system itself is not working correctly.  Neuropathic pain may get worse instead of better over time. For some people, it can lead to serious disability. It is important to be aware that severe injury in a limb can occur without a proper, protective pain  response.Burns, cuts, and other injuries may go unnoticed. Without proper treatment, these injuries can become infected or lead to further disability. Take any injury seriously, and consult your caregiver for treatment. DIAGNOSIS    When you have a pain with no known cause, your caregiver will probably ask some specific questions:   Do you have any other conditions, such as diabetes, shingles, multiple sclerosis, or HIV infection?  How would you describe your pain? (Neuropathic pain is often described as shooting, stabbing, burning, or searing.)  Is your pain worse at any time of the day? (Neuropathic pain is usually worse at night.)  Does the pain seem to follow a certain physical pathway?  Does the pain come from an area that has missing or injured nerves? (An example would be phantom limb pain.)  Is the pain triggered by minor things such as rubbing against the sheets at night? These questions often help define the type of pain involved. Once your caregiver knows what is happening, treatment can begin. Anticonvulsant, antidepressant drugs, and various pain relievers seem to work in some cases. If another condition, such as diabetes is involved, better management of that disorder may relieve the neuropathic pain.  TREATMENT  Neuropathic pain is frequently long-lasting and tends not to respond to treatment with narcotic type pain medication. It may respond well to other drugs such as antiseizure and antidepressant medications. Usually, neuropathic problems do not completely go away, but partial improvement is often possible with proper treatment. Your caregivers have large numbers of medications available to treat you. Do not be discouraged if you do not get immediate relief. Sometimes different medications or a combination of medications will be tried before you receive the results you are hoping for. See your caregiver if you have pain that seems to be coming from nowhere and does not go away. Help is available.  SEEK IMMEDIATE MEDICAL CARE IF:   There is a sudden change in the quality of your pain, especially if the change is on only one side of the body.  You notice changes of the skin, such as redness, black or  purple discoloration, swelling, or an ulcer.  You cannot move the affected limbs. Document Released: 09/21/2003 Document Revised: 03/18/2011 Document Reviewed: 09/21/2003 [facility] Patient Information 2015 [employer], [place]. This information is not intended to replace advice given to you by your health care provider. Make sure you discuss any questions you have with your health care provider.   Emergency Department Resource Guide 1) Find a Doctor and Pay Out of Pocket Although you won't have to find out who is covered by your insurance plan, it is a good idea to ask around and get recommendations. You will then need to call the office and see if the doctor you have chosen will accept you as a new patient and what types of options they offer for patients who are self-pay. Some doctors offer discounts or will set up payment plans for their patients who do not have insurance, but you will need to ask so you aren't surprised when you get to your appointment.  2) Contact Your Local Health Department Not all health departments have doctors that can see patients for sick visits, but many do, so it is worth a call to see if yours does. If you don't know where your local health department is, you can check in your phone book. The CDC also has a tool to help you locate your state's health department, and many state websites also have listings  of all of their local health departments.  3) Find a Walk-in Clinic If your illness is not likely to be very severe or complicated, you may want to try a walk in clinic. These are popping up all over the country in pharmacies, drugstores, and shopping centers. They're usually staffed by nurse practitioners or physician assistants that have been trained to treat common illnesses and complaints. They're usually fairly quick and inexpensive. However, if you have serious medical issues or chronic medical problems, these are probably not your best option.  No Primary Care  Doctor: - Call Health Connect at  [phone] - they can help you locate a primary care doctor that  accepts your insurance, provides certain services, etc. - Physician Referral Service- [phone]  Chronic Pain Problems: Organization         Address  Phone   Notes  [facility]  [phone] Patients need to be referred by their primary care doctor.   Medication Assistance: Organization         Address  Phone   Notes  [facility] Medication [facility] [address] [place], [place] [address] [phone] --Must be a resident of [facility] -- Must have NO insurance coverage whatsoever (no Medicaid/ Medicare, etc.) -- The pt. MUST have a primary care doctor that directs their care regularly and follows them in the community   MedAssist  [phone]   [employer]  [phone]    Agencies that provide inexpensive medical care: Organization         Address  Phone   Notes  [facility]  [phone]   [facility]    [phone]   [facility] [address] [place], [place] [address] [phone]   [facility] [address], [place] [phone]) [phone]   [facility]anned Parenthood    [phone]   [facility]    [phone]   [facility] Health and [facility]  [address], [place] Phone:  [phone]) [phone], Fax:  [phone] Hours of Operation:  9 am - 6 pm, M-F.  Also accepts Medicaid/Medicare and self-pay.  [facility]  [address], [place] Phone: [phone], Fax: [phone]. Hours of Operation:  8:30 am - 5:30 pm, M-F.  Also accepts Medicaid and self-pay.  [facility] [place] [address], [place] Phone: [phone]) [phone]   [facility] [address], [place], [place] [phone], Ext. [phone] Mondays & Thursdays: 7-9 AM.  First 15 patients are seen on a first  come, first serve basis.    Medicaid-accepting [facility] Providers:  Organization         Address  Phone   Notes  [facility] [address], [place] [phone] Also accepts self-pay patients.  [facility] [address], [place]  [phone]   [facility] [address], [place] [phone]) [phone]   [facility] [address] [place] [address], [place] [phone]   Renaye RakersVeita Bland [address], [place]   [phone] Only accepts [place] Access [place] patients after they have their name applied to their card.   Self-Pay (no insurance) in [facility] [place]:  Retail buyerrganization         Address  Phone   Notes  Sickle Cell Patients, [facility] [address] [place], [place] [phone]   [facility] [address] [place], [place] [phone]   [facility]  [address], [place] [phone]   [facility]/Dr. Osei-Bonsu  [address] [place] or [address], [place] [phone] Phone number for both [place] and [place] locations is the same.  [facility] and [facility] [address], [place] [phone]   [facility] [address] [place] or [address] [phone]   [facility] [address], [place] [phone], phone; [phone], fax Sees patients 1st and 3rd Saturday of every month.  Must not qualify for public or private insurance (i.e. Medicaid, Medicare, [place] Health Choice, Veterans' Benefits)  Household income should be no more than 200% of the poverty level The clinic cannot treat you if you are pregnant or think you are pregnant  Sexually transmitted diseases are not treated at the clinic.    Dental Care: Organization          Address  Phone  Notes  [facility] of [facility] [address] [place], [place] [phone] Accepts children up to age 94 who are enrolled in [place] or [place] Health Choice; pregnant women with a Medicaid card; and children who have applied for Medicaid or [place] Health Choice, but were declined, whose parents can pay a reduced fee at time of service.  [facility]  [address], [place] [phone] Accepts children up to age 41 who are enrolled in [place] or [place] Health Choice; pregnant women with a Medicaid card; and children who have applied for Medicaid or  Health Choice, but were declined, whose parents can pay a reduced fee at time of service.  [facility]  [address] [place], [place] [phone] Patients are seen by appointment only. Walk-ins are not accepted. [facility] will see patients 58 years of age and older. Monday - Tuesday (8am-5pm) Most Wednesdays (8:30-5pm) $30 per visit, cash only  [facility]  [address], [facility] [phone] Patients are seen by appointment only. Walk-ins are not accepted. [facility] will see patients 10 years of age and older. One Wednesday Evening (Monthly: Volunteer Based).  $30 per visit, cash only  [employer]  [phone] for adults; Children under age 54, call Graduate Pediatric Dentistry at [phone]. Children aged 28-14, please call [phone] to request a pediatric application.  Dental services are provided in all areas of dental care including fillings, crowns and bridges, complete and partial dentures, implants, gum treatment, root canals, and extractions. Preventive care is also provided. Treatment is provided to both adults and children. Patients are selected via a lottery and there is often a waiting list.   [facility] [address], [place]  [phone] [email or web address]   [facility] [address] [place], [place] [phone], Ext. [phone] Second and Fourth Thursday of each month, opens at 6:30 AM; Clinic ends at 9 AM.  Patients are seen on a first-come first-served basis, and a limited number are seen during each clinic.   [facility]  [address] [place], [place] [phone]   Eligibility  Requirements You must have lived in [place], or [place] counties for at least the last three months.   You cannot be eligible for state or federal sponsored [place], including [employer], [place], or [employer].   You generally cannot be eligible for healthcare insurance through your employer.    How to apply: Eligibility screenings are held every Tuesday and Wednesday afternoon from 1:00 pm until 4:00 pm. You do not need an appointment for the interview!  [facility] [address], [place], [place] [phone]   [facility] Health Department  [phone]   [facility] Health Department  [phone]   [facility] Health Department  [phone]    Behavioral Health Resources in the Community: Intensive Outpatient Programs Organization         Address  Phone  Notes  [facility] [address] [place], [place] [phone]   [facility] Outpatient [address] [place], [place] [phone]   [facility] [address] [place], [place]  [phone]   [facility] Mental Health [address]  [place], [place] [phone] or [phone]   Substance Abuse Resources Organization         Address  Phone  Notes  [facility] Drug Services  [phone]   [facility]  [phone]   [facility]  [phone]   Floydene Flock  [phone]   Residential & Outpatient Substance Abuse Program  [phone]   Psychological  Services Organization         Address  Phone  Notes  [facility] Behavioral Health  [phone]   [facility] Services  [phone]   [facility] [address], [place] [phone] or [phone]    Mobile Crisis Teams Organization         Address  Phone  Notes  [facility], Mobile Crisis Care Unit  [phone]   [facility]  [address] [place], [place] [phone]   Doristine Locks [address] [place] [phone]    Self-Help/Support Groups Organization         Address  Phone             Notes  [facility] Assoc. of [place] - variety of support groups  [phone]- I7437963 Call for more information  Narcotics Anonymous (NA), [facility] [address], [employer] [place]  2 meetings at this location   Statistician         Address  Phone  Notes  [facility] [address],    [place]  [phone]   [facility]  [address], [place] [address], [place], [place] [phone]   [facility] [address] [place], [place] [phone] Admissions: 8am-3pm M-F  [facility] [address],    [place], [place] [phone]   [facility] [address] [place]s [place], [place] [phone]   [facility] [address],  [place], [place] [phone]   [facility] - Intensive Outpatient [address], [place], [place] [phone]   [facility] ([facility]iction [facility] [address]  [place], [place] [phone] or [phone]   [facility] ([facility]) [address], [place], [place] [phone] Accepts Medicaid  [facility] [address],  [place] [phone] Substance Abuse/Addiction Treatment   [facility] Resources Organization  Address  Phone  Notes  [facility]  [phone]   Domenic Schwab, PhD [address] [place], [place]   [phone] or [phone]   [place], [place] [phone]   [address], [place], [place] [phone] Insurance/Medicaid/sponsorship through [facility] and Families [address] [place]                                    [place], [place] [phone] [place] [address], [place] [phone]    Dr. Adele Schilder  [phone]   [facility] [place] Dept. 1) [address], [place] 2) [place] 3)  [address] [place] [address], [place] [phone]  [phone]   [place] [phone] or [phone] (After Hours)

## 2013-09-02 NOTE — ED Provider Notes (Signed)
Medical screening examination/treatment/procedure(s) were performed by a resident physician or non-physician practitioner and as the supervising physician I was immediately available for consultation/collaboration.  Shelly Flatten, MD Family Medicine   Ozella Rocks, MD 09/02/13 1140

## 2013-09-02 NOTE — ED Notes (Signed)
Per patient she was seen here last night for leg pains, hot sensation, legs feeling weak when walking. Pt also reports some facial numbness last night while sleeping, now resolved. Pt awke, alert, oriented x4, with steady gait, stroke scale neg. States tried taking prescribed gabapentin but no improvement in symptoms.

## 2013-11-11 ENCOUNTER — Encounter (HOSPITAL_COMMUNITY): Payer: Self-pay | Admitting: Emergency Medicine

## 2013-11-11 ENCOUNTER — Emergency Department (HOSPITAL_COMMUNITY)
Admission: EM | Admit: 2013-11-11 | Discharge: 2013-11-12 | Disposition: A | Discharge status: Home / Self Care | Payer: Self-pay | Attending: Emergency Medicine | Admitting: Emergency Medicine

## 2013-11-11 DIAGNOSIS — L255 Unspecified contact dermatitis due to plants, except food: Secondary | ICD-10-CM | POA: Insufficient documentation

## 2013-11-11 DIAGNOSIS — Z79899 Other long term (current) drug therapy: Secondary | ICD-10-CM | POA: Insufficient documentation

## 2013-11-11 DIAGNOSIS — Z7952 Long term (current) use of systemic steroids: Secondary | ICD-10-CM | POA: Insufficient documentation

## 2013-11-11 NOTE — ED Notes (Signed)
Pt. reports itch rashes at right lateral torso and bilateral outer ear onset Tuesday this week , denies fever , respirations unlabored / airway intact .

## 2013-11-12 MED ORDER — HYDROCORTISONE 1 % EX CREA: TOPICAL_CREAM | CUTANEOUS | Status: DC

## 2013-11-12 MED ORDER — DIPHENHYDRAMINE HCL 25 MG PO TABS: 25.0000 mg | ORAL_TABLET | Freq: Four times a day (QID) | ORAL | Status: DC

## 2013-11-12 NOTE — ED Provider Notes (Signed)
CSN: [id number]     Arrival date & time 11/11/13  2256 History   First MD Initiated Contact with Patient 11/11/13 2350     Chief Complaint  Patient presents with  . Rash   Patient is a 28 y.o. female presenting with rash. The history is provided by the patient. No language interpreter was used.  Rash Location:  Torso Torso rash location:  R flank Quality: itchiness and redness   Severity:  Moderate Onset quality:  Sudden Duration:  2 days Timing:  Constant Progression:  Unchanged Chronicity:  New Context: plant contact   Context: not animal contact, not chemical exposure, not diapers, not eggs, not exposure to similar rash, not food, not hot tub use, not insect bite/sting, not medications, not new detergent/soap, not nuts, not pollen, not pregnancy, not sick contacts and not sun exposure   Relieved by:  Nothing Worsened by:  Nothing tried Ineffective treatments:  None tried Associated symptoms: no abdominal pain, no diarrhea, no fatigue, no fever, no headaches, no induration, no myalgias, no nausea, no periorbital edema, no shortness of breath, no throat swelling, no tongue swelling and not wheezing    Patient believes that she was in poison ivy.  Her aunt has the same rash and was also outside helping to clear the same leaves.  No past medical history on file. History reviewed. No pertinent past surgical history. No family history on file. History  Substance Use Topics  . Smoking status: Never Smoker   . Smokeless tobacco: Not on file  . Alcohol Use: No   OB History    No data available     Review of Systems  Constitutional: Negative for fever and fatigue.  Respiratory: Negative for shortness of breath and wheezing.   Gastrointestinal: Negative for nausea, abdominal pain and diarrhea.  Musculoskeletal: Negative for myalgias.  Skin: Positive for rash.  Neurological: Negative for headaches.      Allergies  Review of patient's allergies indicates no known  allergies.  Home Medications   Prior to Admission medications   Medication Sig Start Date End Date Taking? Authorizing Provider  diphenhydrAMINE (BENADRYL) 25 MG tablet Take 1 tablet (25 mg total) by mouth every 6 (six) hours. 11/12/13   Jazmine Longshore A Forcucci, PA-C  gabapentin (NEURONTIN) 300 MG capsule Take 1 capsule (300 mg total) by mouth 3 (three) times daily. 09/01/13   Graylon GoodZachary H Baker, PA-C  hydrocortisone cream 1 % Apply to affected area 2 times daily 11/12/13   Acasia Skilton A Forcucci, PA-C   BP 115/63 mmHg  Pulse 56  Temp(Src) 97.6 F (36.4 C) (Oral)  Resp 20  Ht 5\' 3"  (1.6 m)  Wt 149 lb (67.586 kg)  BMI 26.40 kg/m2  SpO2 95%  LMP 10/16/2013 Physical Exam  Constitutional: She is oriented to person, place, and time. She appears well-developed and well-nourished. No distress.  HENT:  Head: Normocephalic and atraumatic.  Mouth/Throat: Oropharynx is clear and moist. No oropharyngeal exudate.  Eyes: Conjunctivae and EOM are normal. Pupils are equal, round, and reactive to light. No scleral icterus.  Neck: Normal range of motion. Neck supple. No JVD present. No thyromegaly present.  Cardiovascular: Normal rate, regular rhythm, normal heart sounds and intact distal pulses.  Exam reveals no gallop and no friction rub.   No murmur heard. Pulmonary/Chest: Effort normal and breath sounds normal. No respiratory distress. She has no wheezes. She has no rales. She exhibits no tenderness.  Musculoskeletal: Normal range of motion.  Lymphadenopathy:    She has  no cervical adenopathy.  Neurological: She is alert and oriented to person, place, and time.  Skin: Skin is warm and dry. Rash noted. She is not diaphoretic.     Psychiatric: She has a normal mood and affect. Her behavior is normal. Judgment and thought content normal.  Nursing note and vitals reviewed.   ED Course  Procedures (including critical care time) Labs Review Labs Reviewed - No data to display  Imaging Review No results  found.   EKG Interpretation None      MDM   Final diagnoses:  Contact dermatitis due to plant   Patient is a 28 y.o. Female who presents to the ED with rash to the right side.  There is maculopapular rash to the right flank with no concerning symptoms.  Suspect that this is likely plant induced contact dermatitis.  Given localized reaction will treat with benadryl and hydrocortisone cream.  Patient to return for worsening shortness of breath, facial swelling, or any other concerning symptoms.  Patient states understanding and agreement.        Eben Burowourtney A Forcucci, PA-C 11/12/13 0025  Ethelda ChickMartha K Linker, MD 11/12/13 [id number]

## 2013-11-12 NOTE — Discharge Instructions (Signed)
Contact Dermatitis °Contact dermatitis is a reaction to certain substances that touch the skin. Contact dermatitis can be either irritant contact dermatitis or allergic contact dermatitis. Irritant contact dermatitis does not require previous exposure to the substance for a reaction to occur. Allergic contact dermatitis only occurs if you have been exposed to the substance before. Upon a repeat exposure, your body reacts to the substance.  °CAUSES  °Many substances can cause contact dermatitis. Irritant dermatitis is most commonly caused by repeated exposure to mildly irritating substances, such as: °· Makeup. °· Soaps. °· Detergents. °· Bleaches. °· Acids. °· Metal salts, such as nickel. °Allergic contact dermatitis is most commonly caused by exposure to: °· Poisonous plants. °· Chemicals (deodorants, shampoos). °· Jewelry. °· Latex. °· Neomycin in triple antibiotic cream. °· Preservatives in products, including clothing. °SYMPTOMS  °The area of skin that is exposed may develop: °· Dryness or flaking. °· Redness. °· Cracks. °· Itching. °· Pain or a burning sensation. °· Blisters. °With allergic contact dermatitis, there may also be swelling in areas such as the eyelids, mouth, or genitals.  °DIAGNOSIS  °Your caregiver can usually tell what the problem is by doing a physical exam. In cases where the cause is uncertain and an allergic contact dermatitis is suspected, a patch skin test may be performed to help determine the cause of your dermatitis. °TREATMENT °Treatment includes protecting the skin from further contact with the irritating substance by avoiding that substance if possible. Barrier creams, powders, and gloves may be helpful. Your caregiver may also recommend: °· Steroid creams or ointments applied 2 times daily. For best results, soak the rash area in cool water for 20 minutes. Then apply the medicine. Cover the area with a plastic wrap. You can store the steroid cream in the refrigerator for a "chilly"  effect on your rash. That may decrease itching. Oral steroid medicines may be needed in more severe cases. °· Antibiotics or antibacterial ointments if a skin infection is present. °· Antihistamine lotion or an antihistamine taken by mouth to ease itching. °· Lubricants to keep moisture in your skin. °· Burow's solution to reduce redness and soreness or to dry a weeping rash. Mix one packet or tablet of solution in 2 cups cool water. Dip a clean washcloth in the mixture, wring it out a bit, and put it on the affected area. Leave the cloth in place for 30 minutes. Do this as often as possible throughout the day. °· Taking several cornstarch or baking soda baths daily if the area is too large to cover with a washcloth. °Harsh chemicals, such as alkalis or acids, can cause skin damage that is like a burn. You should flush your skin for 15 to 20 minutes with cold water after such an exposure. You should also seek immediate medical care after exposure. Bandages (dressings), antibiotics, and pain medicine may be needed for severely irritated skin.  °HOME CARE INSTRUCTIONS °· Avoid the substance that caused your reaction. °· Keep the area of skin that is affected away from hot water, soap, sunlight, chemicals, acidic substances, or anything else that would irritate your skin. °· Do not scratch the rash. Scratching may cause the rash to become infected. °· You may take cool baths to help stop the itching. °· Only take over-the-counter or prescription medicines as directed by your caregiver. °· See your caregiver for follow-up care as directed to make sure your skin is healing properly. °SEEK MEDICAL CARE IF:  °· Your condition is not better after 3   days of treatment.  You seem to be getting worse.  You see signs of infection such as swelling, tenderness, redness, soreness, or warmth in the affected area.  You have any problems related to your medicines. Document Released: 12/22/1999 Document Revised: 03/18/2011  Document Reviewed: 05/29/2010 [facility] Patient Information 2015 [employer], [place]. This information is not intended to replace advice given to you by your health care provider. Make sure you discuss any questions you have with your health care provider.   Poison [employer] Poison ivy is a inflammation of the skin (contact dermatitis) caused by touching the allergens on the leaves of the ivy plant following previous exposure to the plant. The rash usually appears 48 hours after exposure. The rash is usually bumps (papules) or blisters (vesicles) in a linear pattern. Depending on your own sensitivity, the rash may simply cause redness and itching, or it may also progress to blisters which may break open. These must be well cared for to prevent secondary bacterial (germ) infection, followed by scarring. Keep any open areas dry, clean, dressed, and covered with an antibacterial ointment if needed. The eyes may also get puffy. The puffiness is worst in the morning and gets better as the day progresses. This dermatitis usually heals without scarring, within 2 to 3 weeks without treatment. HOME CARE INSTRUCTIONS  Thoroughly wash with soap and water as soon as you have been exposed to poison ivy. You have about one half hour to remove the plant resin before it will cause the rash. This washing will destroy the oil or antigen on the skin that is causing, or will cause, the rash. Be sure to wash under your fingernails as any plant resin there will continue to spread the rash. Do not rub skin vigorously when washing affected area. Poison ivy cannot spread if no oil from the plant remains on your body. A rash that has progressed to weeping sores will not spread the rash unless you have not washed thoroughly. It is also important to wash any clothes you have been wearing as these may carry active allergens. The rash will return if you wear the unwashed clothing, even several days later. Avoidance of the plant in the future is the  best measure. Poison ivy plant can be recognized by the number of leaves. Generally, poison ivy has three leaves with flowering branches on a single stem. Diphenhydramine may be purchased over the counter and used as needed for itching. Do not drive with this medication if it makes you drowsy.Ask your caregiver about medication for children. SEEK MEDICAL CARE IF:  Open sores develop.  Redness spreads beyond area of rash.  You notice purulent (pus-like) discharge.  You have increased pain.  Other signs of infection develop (such as fever). Document Released: 12/22/1999 Document Revised: 03/18/2011 Document Reviewed: 06/03/2008 [facility] Patient Information 2015 [place], [place]. This information is not intended to replace advice given to you by your health care provider. Make sure you discuss any questions you have with your health care provider.    Emergency Department Resource Guide 1) Find a Doctor and Pay Out of Pocket Although you won't have to find out who is covered by your insurance plan, it is a good idea to ask around and get recommendations. You will then need to call the office and see if the doctor you have chosen will accept you as a new patient and what types of options they offer for patients who are self-pay. Some doctors offer discounts or will set up payment  plans for their patients who do not have insurance, but you will need to ask so you aren't surprised when you get to your appointment.  2) Contact Your Local Health Department Not all health departments have doctors that can see patients for sick visits, but many do, so it is worth a call to see if yours does. If you don't know where your local health department is, you can check in your phone book. The CDC also has a tool to help you locate your state's health department, and many state websites also have listings of all of their local health departments.  3) Find a [facility] If your illness is not likely to be  very severe or complicated, you may want to try a walk in clinic. These are popping up all over the country in pharmacies, drugstores, and shopping centers. They're usually staffed by nurse practitioners or physician assistants that have been trained to treat common illnesses and complaints. They're usually fairly quick and inexpensive. However, if you have serious medical issues or chronic medical problems, these are probably not your best option.  No Primary Care Doctor: - Call Health Connect at  [phone] - they can help you locate a primary care doctor that  accepts your insurance, provides certain services, etc. - Physician Referral Service- [phone]  Chronic Pain Problems: Organization         Address  Phone   Notes  [facility]  [phone] Patients need to be referred by their primary care doctor.   Medication Assistance: Organization         Address  Phone   Notes  [facility] Medication [address] [facility] [address] [place], [place] [address] [phone] --Must be a resident of [facility] -- Must have NO insurance coverage whatsoever (no Medicaid/ Medicare, etc.) -- The pt. MUST have a primary care doctor that directs their care regularly and follows them in the community   MedAssist  [phone]   [employer]  [phone]    Agencies that provide inexpensive medical care: Organization         Address  Phone   Notes  [place]  [phone]   [facility]    [phone]   [facility] [place], [place] [address] [phone]   [place] [address], [place] [phone]   Planned Parenthood    [phone]   [facility]    [phone]   [place] and [place] [address], [place] Phone:  [phone], Fax:  [phone] Hours of Operation:  9 am - 6 pm, M-F.  Also  accepts Medicaid/Medicare and self-pay.  [facility] for [place], [address], [place] Phone: [phone], Fax: [phone]. Hours of Operation:  8:30 am - 5:30 pm, M-F.  Also accepts Medicaid and self-pay.  [facility] [address], [place] Phone: [phone]   [place], [place], [place] [phone], Ext. [phone] Mondays & Thursdays: 7-9 AM.  First 15 patients are seen on a first come, first serve basis.    [place] Providers:  Organization         Address  Phone   Notes  [facility] [address], [place] [phone] Also accepts self-pay  patients.  [facility] [address], [place]  [phone]   [place], [address], [place] [phone]   [facility] [address] [place] [phone]   Lucianne Lei [address], [place]   [phone] Only accepts [place] Access [place] patients after they have their name applied to their card.   Self-Pay (no insurance) in [facility]:  Organization         Address  Phone   Notes  Sickle Cell Patients, [facility] [place] [phone]   [facility] [place] [phone]   [facility] [place], [place], [place] [phone]   [facility]/Dr. Osei-Bonsu  [address] [place] or [address], [place] [phone] Phone number for both [place] and [place] locations is the same.  Urgent Medical and [facility] [address] [place] [phone]   [facility] [address] [place] or [address] [phone]   [facility] [address],  [place] [phone], phone; [phone], fax Sees patients 1st and 3rd Saturday of every month.  Must not qualify for public or private insurance (i.e. Medicaid, Medicare, [place] Health Choice, Veterans' Benefits)  Household income should be no more than 200% of the poverty level The clinic cannot treat you if you are pregnant or think you are pregnant  Sexually transmitted diseases are not treated at the clinic.    Dental Care: Organization         Address  Phone  Notes  [facility] Department of [facility] [phone] Accepts children up to age 56 who are enrolled in [place] or [place]; pregnant women with a Medicaid card; and children who have applied for Medicaid or [place] Health Choice, but were declined, whose parents can pay a reduced fee at time of service.  [facility] Department of [facility]  [address], [place] [phone] Accepts children up to age 4 who are enrolled in [place] or [place]; pregnant women with a Medicaid card; and children who have applied for Medicaid or  Health Choice, but were declined, whose parents can pay a reduced fee at time of service.  [place] Adult Dental Access PROGRAM  [place] [phone] Patients are seen by appointment only. Walk-ins are not accepted. [place] will see patients 34 years of age and older. Monday - Tuesday (8am-5pm) Most Wednesdays (8:30-5pm) $30 per visit, cash only  [facility] Adult Dental Access PROGRAM  [address], [facility] [phone] Patients are seen by appointment only. Walk-ins are not accepted. [place] will see patients 11 years of age and older. One Wednesday Evening (Monthly: Volunteer Based).  $30 per visit, cash only  [place]  [phone] for adults; Children under age 29, call Graduate Pediatric Dentistry at [phone].  Children aged 71-14, please call [phone] to request a pediatric application.  Dental services are provided in all areas of dental care including fillings, crowns and bridges, complete and partial dentures, implants, gum treatment, root canals, and extractions. Preventive care is also provided. Treatment is provided to  both adults and children. Patients are selected via a lottery and there is often a waiting list.   [facility] [address] [place]  [phone] [email or web address]   [facility] [address] [place], [place] [phone], Ext. [phone] Second and Fourth Thursday of each month, opens at 6:30 AM; Clinic ends at 9 AM.  Patients are seen on a first-come first-served basis, and a limited number are seen during each clinic.   [facility]  [address] Hillard Danker [place], [place] [phone]   Eligibility Requirements You must have lived in [place], [place], or [place] counties for at least the last three months.   You cannot be eligible for state or federal sponsored [employer], including [employer], [place], or [employer].   You generally cannot be eligible for healthcare insurance through your employer.    How to apply: Eligibility screenings are held every Tuesday and Wednesday afternoon from 1:00 pm until 4:00 pm. You do not need an appointment for the interview!  [facility] [address], [place], [place]   [place]  [place] Department  [place]  [phone]    Behavioral Health Resources in the Community: Intensive Outpatient Programs Organization         Address  Phone  Notes  [place]ebo [place]. [address], [place], [place] [phone]   [facility] Outpatient [address], [place], [place]   ADS: Alcohol & Drug Svcs [address], [place], [place]   [place] [address],  [place], [place] or [phone]   Substance Abuse Resources Organization         Address  Phone  Notes  Alcohol and Drug Services  [phone]   [place]  [phone]   [facility] [place]   Chinita Pester  [phone]   Residential & Outpatient Substance Abuse Program  [phone]   Psychological Services Organization         Address  Phone  Notes  [facility]  [place]  [phone]   [place] [address], [place] or [phone]    Mobile Crisis Teams Organization         Address  Phone  Notes  [facility], Mobile Crisis Care Unit  [phone]   [facility]  [address] [place], [place]   [place] [address], [place] [phone]    Self-Help/Support Groups Organization         Address  Phone             Notes  [place]. of [place] - variety of support groups  [place] Call for more information  Narcotics Anonymous (NA), [facility] [address], [employer] [place]  2 meetings at this location   Special educational needs teacher         Address  Phone  Notes  [facility]idential [facility] [place],    [place]  [phone]   [facility]  [address], [place] [address]70340, [place], [place]   [place], [place] [phone] Admissions: 8am-3pm M-F  Incentives Substance [place] [address]    [place], [place] [phone]  [facility] [address], [place], [place] [phone]   [facility] [address]  [place], [place] [phone]   [facility] - Intensive Outpatient [address], [place], [place] [phone]     [facility] ([facility] [address]  [place], [place] [phone] or [phone]   [facility] ([facility]) [address] [place], [place] [phone] Accepts Medicaid  [facility] [address]  [place] [phone] Substance Abuse/Addiction Treatment   [facility] Behavioral Health Resources Organization         Address  Phone  Notes  [facility]  [phone]) [phone]   Angie FavaJulie Brannon, PhD [address], [address] [place], [place]   [phone] or [phone]) [phone]   [facility]   [address] [place], [place] [phone]   [facility] [address], [place], [place] [phone] Insurance/Medicaid/sponsorship through [facility]  Faith and Families [address]                                    [place], [place] [phone] Therapy/tele-psych/case  [facility] [address].   [place], [place] [phone]    Dr. Lolly MustacheArfeen  [phone]   [facility] [place]  [employer] [facility] 1) [address], [place] 2) [address], [place] 3)  [address] [place], [place] [phone]  [phone]) [phone]   [facility] Child Abuse Hotline [phone] or [phone]) [phone] (After Hours)

## 2015-01-10 ENCOUNTER — Emergency Department (HOSPITAL_COMMUNITY)
Admission: EM | Admit: 2015-01-10 | Discharge: 2015-01-10 | Disposition: A | Discharge status: Home / Self Care | Payer: 59 | Attending: Emergency Medicine | Admitting: Emergency Medicine

## 2015-01-10 ENCOUNTER — Encounter (HOSPITAL_COMMUNITY): Payer: Self-pay | Admitting: *Deleted

## 2015-01-10 DIAGNOSIS — Z3202 Encounter for pregnancy test, result negative: Secondary | ICD-10-CM | POA: Insufficient documentation

## 2015-01-10 DIAGNOSIS — N390 Urinary tract infection, site not specified: Secondary | ICD-10-CM | POA: Diagnosis not present

## 2015-01-10 DIAGNOSIS — Z79899 Other long term (current) drug therapy: Secondary | ICD-10-CM | POA: Insufficient documentation

## 2015-01-10 DIAGNOSIS — N72 Inflammatory disease of cervix uteri: Secondary | ICD-10-CM

## 2015-01-10 DIAGNOSIS — R200 Anesthesia of skin: Secondary | ICD-10-CM | POA: Diagnosis not present

## 2015-01-10 DIAGNOSIS — R42 Dizziness and giddiness: Secondary | ICD-10-CM | POA: Insufficient documentation

## 2015-01-10 DIAGNOSIS — Z7952 Long term (current) use of systemic steroids: Secondary | ICD-10-CM | POA: Insufficient documentation

## 2015-01-10 DIAGNOSIS — M545 Low back pain: Secondary | ICD-10-CM | POA: Diagnosis present

## 2015-01-10 LAB — WET PREP, GENITAL
Clue Cells Wet Prep HPF POC: NONE SEEN
Sperm: NONE SEEN
Trich, Wet Prep: NONE SEEN
Yeast Wet Prep HPF POC: NONE SEEN

## 2015-01-10 LAB — URINALYSIS, ROUTINE W REFLEX MICROSCOPIC
Bilirubin Urine: NEGATIVE
Glucose, UA: NEGATIVE mg/dL
Hgb urine dipstick: NEGATIVE
Ketones, ur: NEGATIVE mg/dL
Nitrite: NEGATIVE
Protein, ur: NEGATIVE mg/dL
Specific Gravity, Urine: 1.022 (ref 1.005–1.030)
pH: 5.5 (ref 5.0–8.0)

## 2015-01-10 LAB — PREGNANCY, URINE: Preg Test, Ur: NEGATIVE

## 2015-01-10 LAB — URINE MICROSCOPIC-ADD ON

## 2015-01-10 MED ORDER — AZITHROMYCIN 250 MG PO TABS
1000.0000 mg | ORAL_TABLET | Freq: Once | ORAL | Status: AC
  Administered 2015-01-10: 1000 mg via ORAL
  Filled 2015-01-10: qty 4

## 2015-01-10 MED ORDER — HYDROCODONE-ACETAMINOPHEN 5-325 MG PO TABS: 2.0000 | ORAL_TABLET | ORAL | Status: DC | PRN

## 2015-01-10 MED ORDER — CEFTRIAXONE SODIUM 250 MG IJ SOLR
250.0000 mg | Freq: Once | INTRAMUSCULAR | Status: AC
  Administered 2015-01-10: 250 mg via INTRAMUSCULAR
  Filled 2015-01-10: qty 250

## 2015-01-10 MED ORDER — CEPHALEXIN 500 MG PO CAPS: 500.0000 mg | ORAL_CAPSULE | Freq: Four times a day (QID) | ORAL | Status: DC

## 2015-01-10 NOTE — ED Provider Notes (Signed)
CSN: [id number]     Arrival date & time 01/10/15  1646 History  By signing my name below, I, [facility], attest that this documentation has been prepared under the direction and in the presence of [employer], PA-C. Electronically Signed: Randell Patient, ED Scribe. 01/10/2015. 5:08 PM.    Chief Complaint  Patient presents with  . Back Pain  . Vaginal Discharge   The history is provided by the patient. No language interpreter was used.   HPI Comments: Jeanne Munoz is a 30 y.o. female who presents to the Emergency Department complaining of waxing and waning, moderate, lower back pain onset 2 days ago. She reports that she was seen 1 month ago and treated for a UTI but that symptoms have persisted and new symptoms have presented in the past month. Patient endorses vaginal discharge, urinary frequency, dysuria, pain and bleeding during intercourse for the past 3 weeks.  Patient denies being on birth control or use of condoms. She denies fever, abdominal pain, hematuria, vomiting, and bladder and bowel incontinence. LMP 2 months ago.  History reviewed. No pertinent past medical history. History reviewed. No pertinent past surgical history. History reviewed. No pertinent family history. Social History  Substance Use Topics  . Smoking status: Never Smoker   . Smokeless tobacco: Never Used  . Alcohol Use: No   OB History    No data available     Review of Systems  Constitutional: Negative for fever.  Gastrointestinal: Negative for abdominal pain.  Genitourinary: Positive for dysuria, frequency, vaginal bleeding (During intercourse), vaginal discharge and dyspareunia. Negative for hematuria.  Musculoskeletal: Positive for back pain (Lower back).  Neurological: Positive for dizziness and numbness (Bilateral soles of feet).  All other systems reviewed and are negative.     Allergies  Review of patient's allergies indicates no known allergies.  Home Medications    Prior to Admission medications   Medication Sig Start Date End Date Taking? Authorizing Provider  diphenhydrAMINE (BENADRYL) 25 MG tablet Take 1 tablet (25 mg total) by mouth every 6 (six) hours. 11/12/13   Courtney Forcucci, PA-C  gabapentin (NEURONTIN) 300 MG capsule Take 1 capsule (300 mg total) by mouth 3 (three) times daily. 09/01/13   Graylon Good, PA-C  hydrocortisone cream 1 % Apply to affected area 2 times daily 11/12/13   Courtney Forcucci, PA-C   BP 106/58 mmHg  Pulse 76  Temp(Src) 97.5 F (36.4 C) (Oral)  Resp 18  SpO2 99%  LMP 11/18/2014 Physical Exam  Constitutional: She is oriented to person, place, and time. She appears well-developed and well-nourished. No distress.  HENT:  Head: Normocephalic and atraumatic.  Mouth/Throat: No oropharyngeal exudate.  Eyes: Conjunctivae and EOM are normal. Pupils are equal, round, and reactive to light. Right eye exhibits no discharge. Left eye exhibits no discharge. No scleral icterus.  Cardiovascular: Normal rate, regular rhythm, normal heart sounds and intact distal pulses.  Exam reveals no gallop and no friction rub.   No murmur heard. Pulmonary/Chest: Effort normal and breath sounds normal. No respiratory distress. She has no wheezes. She has no rales. She exhibits no tenderness.  Abdominal: Soft. She exhibits no distension. There is tenderness ( suprapubic TTP). There is no guarding.  No CVA tenderness.  Genitourinary:  Cervix appears red and ulcerated. Exquisitely tender to palpation. No external genital lesions.  Musculoskeletal: Normal range of motion. She exhibits no edema.  Neurological: She is alert and oriented to person, place, and time.  Skin: Skin is warm and dry.  No rash noted. She is not diaphoretic. No erythema. No pallor.  Psychiatric: She has a normal mood and affect. Her behavior is normal.  Nursing note and vitals reviewed.   ED Course  Procedures   DIAGNOSTIC STUDIES: Oxygen Saturation is 99% on RA,  normal by my interpretation.    COORDINATION OF CARE: 4:55 PM Will order urinalysis and perform a pelvic exam. Discussed treatment plan with pt at bedside and pt agreed to plan.  Labs Review Labs Reviewed  WET PREP, GENITAL - Abnormal; Notable for the following:    WBC, Wet Prep HPF POC MANY (*)    All other components within normal limits  URINALYSIS, ROUTINE W REFLEX MICROSCOPIC (NOT [facility]) - Abnormal; Notable for the following:    Leukocytes, UA SMALL (*)    All other components within normal limits  URINE MICROSCOPIC-ADD ON - Abnormal; Notable for the following:    Squamous Epithelial / LPF 6-30 (*)    Bacteria, UA FEW (*)    All other components within normal limits  HIV ANTIBODY (ROUTINE TESTING)  PREGNANCY, URINE  GC/CHLAMYDIA PROBE AMP ([place]) NOT [facility]    I have personally reviewed and evaluated these lab results as part of my medical decision-making.   MDM   Final diagnoses:  Cervicitis  UTI (lower urinary tract infection)   Patient to be discharged with instructions to follow up with OBGYN. Discussed importance of using protection when sexually active. Pt understands that they have GC/Chlamydia cultures pending and that they will need to inform all sexual partners if results return positive. Pt has been treated prophylacticly with azithromycin and rocephin due to pts history, pelvic exam, and wet prep with increased WBCs. Pt not concerning for PID because hemodynamically stable and no cervical motion tenderness on pelvic exam. UA reveals infection. Pt d/c home on keflex. Discussed treatment plan with pt who is agreeable. Return precautions outlined in patient discharge instructions.    I personally performed the services described in this documentation, which was scribed in my presence. The recorded information has been reviewed and is accurate.    Lester KinsmanSamantha Tripp LaneDowless, PA-C 01/12/15 2246  Mancel BaleElliott Wentz, MD 01/13/15 1022

## 2015-01-10 NOTE — ED Notes (Signed)
Pt has had lower back pain for 2 days. It gets worse at night. Hurts worse when lying down. Pt went to her pcp for a UTI a month ago and was treated for it. Pt is also c/o urinary frequency and vaginal discharge and pain with intercourse.

## 2015-01-10 NOTE — ED Notes (Signed)
Pt A&OX4, ambulatory at d/c with steady gait, NAD 

## 2015-01-10 NOTE — Discharge Instructions (Signed)
Cervicitis Cervicitis is a soreness and puffiness (inflammation) of the cervix.  HOME CARE  Do not have sex (intercourse) until your doctor says it is okay.  Do not have sex until your partner is treated or as told by your doctor.  Take your antibiotic medicine as told. Finish it even if you start to feel better. GET HELP IF:   Your symptoms that brought you to the doctor come back.  You have a fever. MAKE SURE YOU:   Understand these instructions.  Will watch your condition.  Will get help right away if you are not doing well or get worse.   This information is not intended to replace advice given to you by your health care provider. Make sure you discuss any questions you have with your health care provider.   Document Released: 10/03/2007 Document Revised: 12/29/2012 Document Reviewed: 06/17/2012 Elsevier Interactive Patient Education [employer]  Sexually Transmitted Disease A sexually transmitted disease (STD) is a disease or infection often passed to another person during sex. However, STDs can be passed through nonsexual ways. An STD can be passed through:  Spit (saliva).  Semen.  Blood.  Mucus from the vagina.  Pee (urine). HOW CAN I LESSEN MY CHANCES OF GETTING AN STD?  Use:  Latex condoms.  Water-soluble lubricants with condoms. Do not use petroleum jelly or oils.  Dental dams. These are small pieces of latex that are used as a barrier during oral sex.  Avoid having more than one sex partner.  Do not have sex with someone who has other sex partners.  Do not have sex with anyone you do not know or who is at high risk for an STD.  Avoid risky sex that can break your skin.  Do not have sex if you have open sores on your mouth or skin.  Avoid drinking too much alcohol or taking illegal drugs. Alcohol and drugs can affect your good judgment.  Avoid oral and anal sex acts.  Get shots (vaccines) for HPV and hepatitis.  If you are at risk of  being infected with HIV, it is advised that you take a certain medicine daily to prevent HIV infection. This is called pre-exposure prophylaxis (PrEP). You may be at risk if:  You are a man who has sex with other men (MSM).  You are attracted to the opposite sex (heterosexual) and are having sex with more than one partner.  You take drugs with a needle.  You have sex with someone who has HIV.  Talk with your doctor about if you are at high risk of being infected with HIV. If you begin to take PrEP, get tested for HIV first. Get tested every 3 months for as long as you are taking PrEP.  Get tested for STDs every year if you are sexually active. If you are treated for an STD, get tested again 3 months after you are treated. WHAT SHOULD I DO IF I THINK I HAVE AN STD?  See your doctor.  Tell your sex partner(s) that you have an STD. They should be tested and treated.  Do not have sex until your doctor says it is okay. WHEN SHOULD I GET HELP? Get help right away if:  You have bad belly (abdominal) pain.  You are a man and have puffiness (swelling) or pain in your testicles.  You are a woman and have puffiness in your vagina.   This information is not intended to replace advice given to you by  your health care provider. Make sure you discuss any questions you have with your health care provider.   Document Released: 02/01/2004 Document Revised: 01/14/2014 Document Reviewed: 06/19/2012 Elsevier Interactive Patient Education 2016 Elsevier Inc.  Urinary Tract Infection A urinary tract infection (UTI) can occur any place along the urinary tract. The tract includes the kidneys, ureters, bladder, and urethra. A type of germ called bacteria often causes a UTI. UTIs are often helped with antibiotic medicine.  HOME CARE   If given, take antibiotics as told by your doctor. Finish them even if you start to feel better.  Drink enough fluids to keep your pee (urine) clear or pale  yellow.  Avoid tea, drinks with caffeine, and bubbly (carbonated) drinks.  Pee often. Avoid holding your pee in for a long time.  Pee before and after having sex (intercourse).  Wipe from front to back after you poop (bowel movement) if you are a woman. Use each tissue only once. GET HELP RIGHT AWAY IF:   You have back pain.  You have lower belly (abdominal) pain.  You have chills.  You feel sick to your stomach (nauseous).  You throw up (vomit).  Your burning or discomfort with peeing does not go away.  You have a fever.  Your symptoms are not better in 3 days. MAKE SURE YOU:   Understand these instructions.  Will watch your condition.  Will get help right away if you are not doing well or get worse.   This information is not intended to replace advice given to you by your health care provider. Make sure you discuss any questions you have with your health care provider.  Follow up with OBGYN for re-evaluation. You will be contacted with results of gonorrhea and chlamydia in approximately 72 hours. Avoid sexual intercourse until symptoms resolve. Take antibiotics as prescribed. Return to the Emergency Department if you experience sever worsening of your symptoms, vaginal bleeding, increased vaginal discharge, fever, abdominal pain, vomiting.

## 2015-01-11 LAB — HIV ANTIBODY (ROUTINE TESTING W REFLEX): HIV Screen 4th Generation wRfx: NONREACTIVE

## 2015-01-11 LAB — GC/CHLAMYDIA PROBE AMP (~~LOC~~) NOT AT ARMC
Chlamydia: NEGATIVE
Neisseria Gonorrhea: NEGATIVE

## 2015-01-19 ENCOUNTER — Encounter: Payer: Self-pay | Admitting: Obstetrics and Gynecology

## 2015-01-19 ENCOUNTER — Ambulatory Visit (INDEPENDENT_AMBULATORY_CARE_PROVIDER_SITE_OTHER): Payer: 59 | Admitting: Obstetrics and Gynecology

## 2015-01-19 VITALS — BP 103/73 | HR 68 | Temp 97.3°F | Wt 152.8 lb

## 2015-01-19 DIAGNOSIS — Z01419 Encounter for gynecological examination (general) (routine) without abnormal findings: Secondary | ICD-10-CM

## 2015-01-19 DIAGNOSIS — Z124 Encounter for screening for malignant neoplasm of cervix: Secondary | ICD-10-CM | POA: Diagnosis not present

## 2015-01-19 DIAGNOSIS — Z1151 Encounter for screening for human papillomavirus (HPV): Secondary | ICD-10-CM

## 2015-01-19 LAB — POCT URINALYSIS DIP (DEVICE)
Bilirubin Urine: NEGATIVE
GLUCOSE, UA: NEGATIVE mg/dL
KETONES UR: NEGATIVE mg/dL
LEUKOCYTES UA: NEGATIVE
Nitrite: NEGATIVE
PROTEIN: NEGATIVE mg/dL
Specific Gravity, Urine: 1.025 (ref 1.005–1.030)
Urobilinogen, UA: 0.2 mg/dL (ref 0.0–1.0)
pH: 7 (ref 5.0–8.0)

## 2015-01-19 NOTE — Progress Notes (Signed)
Used interpreter Marly Adams.  

## 2015-01-19 NOTE — Progress Notes (Signed)
  Subjective:     Jeanne Munoz is a 30 y.o. female P2 who is here for a comprehensive physical exam. The patient reports being treated a few weeks ago for a UTI. She has completed her antibiotics but continues to experiences some suprapubic tenderness at times. Patient is otherwise feeling well . She reports monthly cycles of 6 days. She is sexually active using condoms for contraception.   History reviewed. No pertinent past medical history. History reviewed. No pertinent past surgical history. Family History  Problem Relation Age of Onset  . Diabetes Father     Social History   Social History  . Marital Status: Married    Spouse Name: N/A  . Number of Children: N/A  . Years of Education: N/A   Occupational History  . Not on file.   Social History Main Topics  . Smoking status: Never Smoker   . Smokeless tobacco: Never Used  . Alcohol Use: No  . Drug Use: No  . Sexual Activity: Yes    Birth Control/ Protection: Condom   Other Topics Concern  . Not on file   Social History Narrative   There are no preventive care reminders to display for this patient.     Review of Systems Pertinent items are noted in HPI.   Objective:      GENERAL: Well-developed, well-nourished female in no acute distress.  HEENT: Normocephalic, atraumatic. Sclerae anicteric.  NECK: Supple. Normal thyroid.  LUNGS: Clear to auscultation bilaterally.  HEART: Regular rate and rhythm. BREASTS: Symmetric in size. No palpable masses or lymphadenopathy, skin changes, or nipple drainage. ABDOMEN: Soft, nontender, nondistended. No organomegaly. PELVIC: Normal external female genitalia. Vagina is pink and rugated.  Normal discharge. Normal appearing cervix. Uterus is normal in size. No adnexal mass or tenderness. No suprapubic tenderness EXTREMITIES: No cyanosis, clubbing, or edema, 2+ distal pulses.    Assessment:    Healthy female exam.      Plan:    pap smear collected Urine culture  collected Patient advised to perform monthly self breast exam Patient will be contacted with any abnormal results See After Visit Summary for Counseling Recommendations

## 2015-01-20 LAB — URINE CULTURE
Colony Count: NO GROWTH
ORGANISM ID, BACTERIA: NO GROWTH

## 2015-01-23 LAB — CYTOLOGY - PAP

## 2015-02-08 ENCOUNTER — Encounter (HOSPITAL_COMMUNITY): Payer: Self-pay | Admitting: Emergency Medicine

## 2015-02-08 ENCOUNTER — Emergency Department (HOSPITAL_COMMUNITY)
Admission: EM | Admit: 2015-02-08 | Discharge: 2015-02-08 | Disposition: A | Discharge status: Home / Self Care | Payer: 59 | Attending: Emergency Medicine | Admitting: Emergency Medicine

## 2015-02-08 DIAGNOSIS — R1030 Lower abdominal pain, unspecified: Secondary | ICD-10-CM | POA: Diagnosis present

## 2015-02-08 DIAGNOSIS — Z3202 Encounter for pregnancy test, result negative: Secondary | ICD-10-CM | POA: Diagnosis not present

## 2015-02-08 DIAGNOSIS — N72 Inflammatory disease of cervix uteri: Secondary | ICD-10-CM | POA: Diagnosis not present

## 2015-02-08 DIAGNOSIS — Z87440 Personal history of urinary (tract) infections: Secondary | ICD-10-CM | POA: Diagnosis not present

## 2015-02-08 LAB — URINE MICROSCOPIC-ADD ON

## 2015-02-08 LAB — CBC
HCT: 38.8 % (ref 36.0–46.0)
HEMOGLOBIN: 13.1 g/dL (ref 12.0–15.0)
MCH: 28.4 pg (ref 26.0–34.0)
MCHC: 33.8 g/dL (ref 30.0–36.0)
MCV: 84.2 fL (ref 78.0–100.0)
Platelets: 156 10*3/uL (ref 150–400)
RBC: 4.61 MIL/uL (ref 3.87–5.11)
RDW: 13.4 % (ref 11.5–15.5)
WBC: 4.6 10*3/uL (ref 4.0–10.5)

## 2015-02-08 LAB — WET PREP, GENITAL
CLUE CELLS WET PREP: NONE SEEN
Sperm: NONE SEEN
TRICH WET PREP: NONE SEEN
YEAST WET PREP: NONE SEEN

## 2015-02-08 LAB — COMPREHENSIVE METABOLIC PANEL
ALK PHOS: 38 U/L (ref 38–126)
ALT: 18 U/L (ref 14–54)
ANION GAP: 7 (ref 5–15)
AST: 22 U/L (ref 15–41)
Albumin: 4.4 g/dL (ref 3.5–5.0)
BUN: 13 mg/dL (ref 6–20)
CALCIUM: 9.8 mg/dL (ref 8.9–10.3)
CO2: 30 mmol/L (ref 22–32)
CREATININE: 0.8 mg/dL (ref 0.44–1.00)
Chloride: 103 mmol/L (ref 101–111)
GFR calc Af Amer: >60 mL/min (ref >60)
GFR calc non Af Amer: >60 mL/min (ref >60)
GLUCOSE: 95 mg/dL (ref 65–99)
Potassium: 3.6 mmol/L (ref 3.5–5.1)
Sodium: 140 mmol/L (ref 135–145)
Total Bilirubin: 0.6 mg/dL (ref 0.3–1.2)
Total Protein: 7.5 g/dL (ref 6.5–8.1)

## 2015-02-08 LAB — URINALYSIS, ROUTINE W REFLEX MICROSCOPIC
BILIRUBIN URINE: NEGATIVE
GLUCOSE, UA: NEGATIVE mg/dL
Ketones, ur: NEGATIVE mg/dL
Nitrite: NEGATIVE
Protein, ur: NEGATIVE mg/dL
SPECIFIC GRAVITY, URINE: 1.016 (ref 1.005–1.030)
pH: 7.5 (ref 5.0–8.0)

## 2015-02-08 LAB — POC URINE PREG, ED: PREG TEST UR: NEGATIVE

## 2015-02-08 LAB — LIPASE, BLOOD: Lipase: 63 U/L — ABNORMAL HIGH (ref 11–51)

## 2015-02-08 MED ORDER — HYDROCODONE-ACETAMINOPHEN 5-325 MG PO TABS
1.0000 | ORAL_TABLET | Freq: Once | ORAL | Status: AC
  Administered 2015-02-08: 1 via ORAL
  Filled 2015-02-08: qty 1

## 2015-02-08 MED ORDER — ACYCLOVIR 400 MG PO TABS: 400.0000 mg | ORAL_TABLET | Freq: Three times a day (TID) | ORAL | Status: AC

## 2015-02-08 NOTE — ED Provider Notes (Signed)
CSN: [id number]     Arrival date & time 02/08/15  1508 History   None    Chief Complaint  Patient presents with  . Abdominal Pain     (Consider location/radiation/quality/duration/timing/severity/associated sxs/prior Treatment) HPI  64 y f w no sig PMH but multiple ed visits in the past month for lower abdominal pain, dysuria, dyspareunia.   She has been dx with uti and cervicitis and treated with keflex, rocephin/azitrho for uti and cervicitis.  Her gc/chl was neg, her wet prep was neg.  She was seen by ob/gyn and had a pap smear that was neg.  She comes back in today with the same complaint of mild lower abdominal pain, constipation with regular stools but hard bowel movements, pain with urination and pain with vaginal intercourse for the past couple of weeks.  She notes that her sx improved for a few days after her last ED visit where she was rx abx but her sx have since resumed.  History reviewed. No pertinent past medical history. History reviewed. No pertinent past surgical history. Family History  Problem Relation Age of Onset  . Diabetes Father    Social History  Substance Use Topics  . Smoking status: Never Smoker   . Smokeless tobacco: Never Used  . Alcohol Use: No   OB History    Gravida Para Term Preterm AB TAB SAB Ectopic Multiple Living       Review of Systems  Constitutional: Negative for fever and chills.  HENT: Negative for nosebleeds.   Eyes: Negative for visual disturbance.  Respiratory: Negative for cough and shortness of breath.   Cardiovascular: Negative for chest pain.  Gastrointestinal: Negative for nausea, vomiting, abdominal pain, diarrhea and constipation.  Genitourinary: Positive for dysuria, vaginal pain, pelvic pain and dyspareunia. Negative for hematuria, vaginal discharge and genital sores.  Skin: Negative for rash.  Neurological: Negative for weakness.  All other systems reviewed and are negative.     Allergies  Review  of patient's allergies indicates no known allergies.  Home Medications   Prior to Admission medications   Medication Sig Start Date End Date Taking? Authorizing Provider  acyclovir (ZOVIRAX) 400 MG tablet Take 1 tablet (400 mg total) by mouth 3 (three) times daily. 02/08/15 02/14/15  Silas Flood, MD   BP 106/66 mmHg  Pulse 67  Temp(Src) 97.7 F (36.5 C) (Oral)  Resp 18  SpO2 97%  LMP 01/18/2015 Physical Exam  Constitutional: She is oriented to person, place, and time. No distress.  HENT:  Head: Normocephalic and atraumatic.  Eyes: EOM are normal. Pupils are equal, round, and reactive to light.  Neck: Normal range of motion. Neck supple.  Cardiovascular: Normal rate and intact distal pulses.   Pulmonary/Chest: No respiratory distress.  Abdominal: Soft. There is no tenderness.  Genitourinary:  Single 0.5 cm lesion on the cervix at the 5 o'clock position that is erythematous and vesicular.  No skin findings on the labia.  Musculoskeletal: Normal range of motion.  Neurological: She is alert and oriented to person, place, and time.  Skin: No rash noted. She is not diaphoretic.  Psychiatric: She has a normal mood and affect.    ED Course  Procedures (including critical care time) Labs Review Labs Reviewed  WET PREP, GENITAL - Abnormal; Notable for the following:    WBC, Wet Prep HPF POC MANY (*)    All other components within normal limits  LIPASE, BLOOD - Abnormal; Notable  for the following:    Lipase 63 (*)    All other components within normal limits  URINALYSIS, ROUTINE W REFLEX MICROSCOPIC (NOT AT [facility]) - Abnormal; Notable for the following:    APPearance TURBID (*)    Hgb urine dipstick MODERATE (*)    Leukocytes, UA SMALL (*)    All other components within normal limits  URINE MICROSCOPIC-ADD ON - Abnormal; Notable for the following:    Squamous Epithelial / LPF 0-5 (*)    Bacteria, UA RARE (*)    All other components within normal limits  COMPREHENSIVE METABOLIC PANEL   CBC  HERPES SIMPLEX VIRUS(HSV) DNA BY PCR  POC URINE PREG, ED  WET PREP  (BD AFFIRM) ([place])  GC/CHLAMYDIA PROBE AMP ([place]) NOT AT [facility]    Imaging Review No results found. I have personally reviewed and evaluated these images and lab results as part of my medical decision-making.   EKG Interpretation None      MDM   Final diagnoses:  Cervicitis    29 y f w no sig PMH but multiple ed visits in the past month for lower abdominal pain, dysuria, dyspareunia.    On exam, she has a lesion on her cervix that appears almost vesicular.  She has pain with cervical motion but no unilateral pain, she has no sig ttp in her abdomen.  Doubt appy, doubt diverticulitis, doubt sbo.  Concern for uti vs. Pid, doubt TOA given no lateralizing findings.  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  Will send hsv swab and will empirically treat with acyclovir.    Wet preg neg.  ua neg.  Cbc/cmp unrmearkable.  Will have pt f/u with ob.  Pt already has contact info for ob as she has seen them before.  I have discussed the results, Dx and Tx plan with the pt. They expressed understanding and agree with the plan and were told to return to ED with any worsening of condition or concern.    Disposition: Discharge  Condition: Good  Discharge Medication List as of 02/08/2015  9:37 PM    START taking these medications   Details  acyclovir (ZOVIRAX) 400 MG tablet Take 1 tablet (400 mg total) by mouth 3 (three) times daily., Starting 02/08/2015, Until Tue 02/14/15, Print        Pt seen in conjunction with Dr. Sheppard Coil, MD 02/09/15 1257  Margarita Grizzle, MD 02/09/15 2153

## 2015-02-08 NOTE — ED Notes (Signed)
Pelvic cart set up at bedside  

## 2015-02-08 NOTE — Discharge Instructions (Signed)
Cervicitis °(Cervicitis) °Es el dolor e hinchazón (inflamación) del cuello uterino. El cuello del útero se encuentra en la parte inferior del útero. Se abre hacia la vagina. °CAUSAS  °· Enfermedades de transmisión sexual (ETS).   °· Reacciones alérgicas.   °· Medicamentos o dispositivos anticonceptivos que se colocan en la vagina.   °· Traumatismo en el cuello del útero.   °· Infecciones bacterianas.   °FACTORES DE RIESGO °Usted tendrá mayor riesgo de sufrir esta enfermedad si: °· Tiene relaciones sexuales sin protección. °· Ha tenido relaciones sexuales con varias parejas. °· Comienza a tener relaciones sexuales a una edad temprana. °· Tiene una historia de ETS. °SÍNTOMAS  °Puede ser que no haya síntomas. Si hay síntomas, pueden ser:  °· Secreción vaginal de color gris, blanco, amarillo o que tiene mal olor.   °· Picazón o dolor en la zona externa de la vagina.   °· Relaciones sexuales dolorosas.   °· Dolor en la zona baja del abdomen o la espalda, especialmente durante las relaciones sexuales.   °· Ganas de orinar con frecuencia.   °· Sangrado vaginal anormal entre períodos, después de las relaciones sexuales o después de la menopausia.   °· Sensación de presión o pesadez en la pelvis.   °DIAGNÓSTICO  °El diagnóstico se realiza mediante un examen pélvico. Otras pruebas son:  °· Examen de las secreciones en el microscopio (preparado fresco).   °· Papanicolau   °TRATAMIENTO  °El tratamiento dependerá de la causa de la cervicitis. Si la causa es una enfermedad de transmisión sexual, usted y su pareja necesitarán realizar un tratamiento. Le prescribirán antibióticos.  °INSTRUCCIONES PARA EL CUIDADO EN EL HOGAR  °· No tenga relaciones sexuales hasta que el médico la autorice.   °· No tenga relaciones sexuales hasta que su compañero haya sido tratado si la causa de la cervicitis es una enfermedad de transmisión sexual.   °· Tome los antibióticos como se le indicó. Tómelos todos, aunque se sienta mejor.   °SOLICITE  ATENCIÓN MÉDICA SI: °· Los síntomas vuelven a aparecer.   °· Tiene fiebre.   °ASEGÚRESE DE QUE:  °· Comprende estas instrucciones. °· Controlará su afección. °· Recibirá ayuda de inmediato si no mejora o si empeora. °  °Esta información no tiene como fin reemplazar el consejo del médico. Asegúrese de hacerle al médico cualquier pregunta que tenga. °  °Document Released: 12/24/2004 Document Revised: 08/26/2012 °Elsevier Interactive Patient Education ©2016 Else[employer] Inc. ° °

## 2015-02-08 NOTE — ED Notes (Signed)
Pt reports that she was treated for a UTI one month ago and began to feel better but the pain has come back and it is worse now. Pt also reports possible blood in her stool and painful bowel movements. Pt alert x4. NAD at this time.

## 2015-02-09 LAB — GC/CHLAMYDIA PROBE AMP (~~LOC~~) NOT AT ARMC
CHLAMYDIA, DNA PROBE: NEGATIVE
Neisseria Gonorrhea: NEGATIVE

## 2015-02-10 LAB — HERPES SIMPLEX VIRUS(HSV) DNA BY PCR
HSV 1 DNA: NEGATIVE
HSV 2 DNA: NEGATIVE

## 2015-02-23 ENCOUNTER — Encounter: Payer: Self-pay | Admitting: Obstetrics and Gynecology

## 2015-02-23 ENCOUNTER — Ambulatory Visit (INDEPENDENT_AMBULATORY_CARE_PROVIDER_SITE_OTHER): Payer: 59 | Admitting: Obstetrics and Gynecology

## 2015-02-23 VITALS — BP 105/61 | HR 63 | Temp 98.1°F | Ht 64.0 in | Wt 148.0 lb

## 2015-02-23 DIAGNOSIS — R300 Dysuria: Secondary | ICD-10-CM | POA: Diagnosis not present

## 2015-02-23 DIAGNOSIS — R102 Pelvic and perineal pain: Secondary | ICD-10-CM | POA: Diagnosis not present

## 2015-02-23 NOTE — Progress Notes (Signed)
Patient ID: Jeanne Munoz, female   DOB: 1985-02-04, 30 y.o.   MRN: [id number] 30 yo G0 here as an ED follow up. Patient was seen on 02/08/2015 secondary to lower pelvic pain. Patient had HSV cultures collected and treated with acyclovir. She reports resolution of her lower pelvic pain while on acyclovir but now her symptoms has returned. She reports pressure while urinating. She denies fever, chills.  History reviewed. No pertinent past medical history. History reviewed. No pertinent past surgical history. Family History  Problem Relation Age of Onset  . Diabetes Father    Social History  Substance Use Topics  . Smoking status: Never Smoker   . Smokeless tobacco: Never Used  . Alcohol Use: No   ROS See pertinent in HPI  Blood pressure 105/61, pulse 63, temperature 98.1 F (36.7 C), temperature source Oral, height  (1.626 m), weight 148 lb (67.132 kg), last menstrual period 02/10/2015. GENERAL: Well-developed, well-nourished female in no acute distress.  ABDOMEN: Soft, nontender, nondistended.  PELVIC: Not performed EXTREMITIES: No cyanosis, clubbing, or edema, 2+ distal pulses.  A/P 30 yo with chronic suprapubic pressure/pain - reviewed negative wet prep and HSV cultures with the patient - Will refer to urology for possible evaluation for interstitial cystitis - patient verbalized understanding and wishes to proceed

## 2015-02-23 NOTE — Progress Notes (Signed)
Raquel used for interpreter 

## 2015-03-13 ENCOUNTER — Encounter: Payer: Self-pay | Admitting: *Deleted

## 2015-07-04 ENCOUNTER — Encounter: Payer: Self-pay | Admitting: Internal Medicine

## 2015-07-04 ENCOUNTER — Ambulatory Visit (INDEPENDENT_AMBULATORY_CARE_PROVIDER_SITE_OTHER): Payer: 59 | Admitting: Internal Medicine

## 2015-07-04 ENCOUNTER — Other Ambulatory Visit (HOSPITAL_COMMUNITY)
Admission: RE | Admit: 2015-07-04 | Discharge: 2015-07-04 | Disposition: A | Discharge status: Home / Self Care | Payer: 59 | Source: Ambulatory Visit | Attending: Family Medicine | Admitting: Family Medicine

## 2015-07-04 VITALS — BP 105/54 | HR 83 | Temp 97.9°F | Ht 66.5 in | Wt 150.0 lb

## 2015-07-04 DIAGNOSIS — R102 Pelvic and perineal pain: Secondary | ICD-10-CM

## 2015-07-04 DIAGNOSIS — R1030 Lower abdominal pain, unspecified: Secondary | ICD-10-CM

## 2015-07-04 DIAGNOSIS — Z113 Encounter for screening for infections with a predominantly sexual mode of transmission: Secondary | ICD-10-CM | POA: Insufficient documentation

## 2015-07-04 DIAGNOSIS — R109 Unspecified abdominal pain: Secondary | ICD-10-CM | POA: Diagnosis not present

## 2015-07-04 DIAGNOSIS — Z7689 Persons encountering health services in other specified circumstances: Secondary | ICD-10-CM

## 2015-07-04 DIAGNOSIS — G8929 Other chronic pain: Secondary | ICD-10-CM | POA: Diagnosis not present

## 2015-07-04 DIAGNOSIS — N949 Unspecified condition associated with female genital organs and menstrual cycle: Secondary | ICD-10-CM

## 2015-07-04 DIAGNOSIS — Z7189 Other specified counseling: Secondary | ICD-10-CM

## 2015-07-04 LAB — POCT WET PREP (WET MOUNT): Clue Cells Wet Prep Whiff POC: NEGATIVE

## 2015-07-04 LAB — POCT URINE PREGNANCY: PREG TEST UR: NEGATIVE

## 2015-07-04 NOTE — Patient Instructions (Signed)
Please make a lab visit after the completion of your period to submit a sample of urine for your urinalysis.   We made an appointment for your transvaginal ultrasound.   I will call you with the results of your testing

## 2015-07-05 DIAGNOSIS — G8929 Other chronic pain: Secondary | ICD-10-CM | POA: Insufficient documentation

## 2015-07-05 DIAGNOSIS — R102 Pelvic and perineal pain: Secondary | ICD-10-CM

## 2015-07-05 NOTE — Progress Notes (Signed)
Date of Visit: 07/04/2015   HPI:  Patient is here to establish care. She also reports of chronic lower abdominal/pelvic pain. She is spanish speaking and a video interpreter was used for the visit.  Chronic Lower abdominal/Pelvic Pain:  - symptoms for 3 months - lower abdominal/pelvic pain that is "hot or burning sensation" that occurs mainly in the evening when she tries to sleep and when she exercises - notes of urinary frequency, intermittent vaginal discharge that is "small in amount and yellow in color" no odor. Denies dysuria.  - also reports that sometimes when she urinates after she exercises, she notices a small amount of blood on the toilet paper; this occurs even when she is not menstruating  - she is currently on her period (second day) - she is sexually active with her husband and does use condoms - she was seen by GYN for this; she had wet prep which only showed many WBC.; she had negative Gc/chlamydia. She was also seen in the ED and had negative vaginal HSV PCR; she was treated empirically for possible HSV cervicitis. GYN referred to urology - Patient was seen by urology in 03/2015: She was thought to have pelvic floor dysfunction and was recommended Pelvic Floor PT and Myrbetriq for one month. Patient reports PT (she attended for 6 weeks) and Myrbetriq (1 month) did not help with symptoms. - no history of recurrent UTI; no history of nephrolithiasis  - last pap smear with HPV testing in 01/2015 was normal with negative HPV  ROS: See HPI.  PMFSH:  No significant PMH Meds: reports taking Vitamin C daily No known allergies No alcohol, tobacco, or other drug use PHQ 2: negative  Family History reviewed and updated in chart  PHYSICAL EXAM: BP 105/54 mmHg  Pulse 83  Temp(Src) 97.9 F (36.6 C)  Ht 5' 6.5" (1.689 m)  Wt 150 lb (68.04 kg)  BMI 23.85 kg/m2  SpO2 100%  LMP 07/02/2015 GEN: NAD  CV: RRR, no murmurs, rubs, or gallops PULM: CTAB, normal effort ABD: Soft,  nondistended, NABS, no organomegaly; reports of mild tenderness in suprapubic region and slightly to the right SKIN: No rash or cyanosis; warm and well-perfused EXTR: No lower extremity edema or calf tenderness PSYCH: Mood and affect euthymic, normal rate and volume of speech NEURO: Awake, alert, no focal deficits grossly, normal speech GYN:  Bimanual exam: no cervical motion tenderness or adnexal tenderness, no masses palpated. Speculum exam: no significant discharge but noted blood as patient is currently menstruating, no significant lesions noted on cervix.    ASSESSMENT/PLAN:  Health maintenance:  - will need tdap at next appointment  Chronic pelvic pain in female Unclear in etiology. Urine pregnancy test is negative. Bimanual exam is unremarkable. Speculum exam not significant. Obtained Gc/Chlamydia/Trich. Wet prep unremarkable.  - instructed patient to make lab visit to obtain UA after completing her period  - [facility] due to continued symptoms.    FOLLOW UP: Follow up after imaging    Palma HolterKanishka G Gunadasa, MD [facility]

## 2015-07-05 NOTE — Assessment & Plan Note (Signed)
Unclear in etiology. Urine pregnancy test is negative. Bimanual exam is unremarkable. Speculum exam not significant. Obtained Gc/Chlamydia/Trich. Wet prep unremarkable.  - instructed patient to make lab visit to obtain UA after completing her period  - [facility] due to continued symptoms.

## 2015-07-06 LAB — CERVICOVAGINAL ANCILLARY ONLY
Chlamydia: NEGATIVE
Neisseria Gonorrhea: NEGATIVE
Trichomonas: NEGATIVE

## 2015-07-12 ENCOUNTER — Ambulatory Visit (HOSPITAL_COMMUNITY)
Admission: RE | Admit: 2015-07-12 | Discharge: 2015-07-12 | Disposition: A | Discharge status: Home / Self Care | Payer: Private Health Insurance - Indemnity | Source: Ambulatory Visit | Attending: Family Medicine | Admitting: Family Medicine

## 2015-07-12 DIAGNOSIS — R102 Pelvic and perineal pain: Secondary | ICD-10-CM | POA: Insufficient documentation

## 2015-07-12 DIAGNOSIS — R109 Unspecified abdominal pain: Secondary | ICD-10-CM

## 2015-07-12 DIAGNOSIS — N949 Unspecified condition associated with female genital organs and menstrual cycle: Secondary | ICD-10-CM | POA: Insufficient documentation

## 2015-07-12 DIAGNOSIS — G8929 Other chronic pain: Secondary | ICD-10-CM | POA: Insufficient documentation

## 2015-07-13 ENCOUNTER — Other Ambulatory Visit: Payer: Private Health Insurance - Indemnity

## 2015-07-14 ENCOUNTER — Other Ambulatory Visit (INDEPENDENT_AMBULATORY_CARE_PROVIDER_SITE_OTHER): Payer: 59

## 2015-07-14 DIAGNOSIS — R109 Unspecified abdominal pain: Secondary | ICD-10-CM | POA: Diagnosis not present

## 2015-07-14 LAB — POCT URINALYSIS DIPSTICK
Bilirubin, UA: NEGATIVE
Glucose, UA: NEGATIVE
Ketones, UA: NEGATIVE
LEUKOCYTES UA: NEGATIVE
NITRITE UA: NEGATIVE
PH UA: 8
PROTEIN UA: NEGATIVE
RBC UA: NEGATIVE
Spec Grav, UA: 1.015
UROBILINOGEN UA: 0.2

## 2015-07-17 ENCOUNTER — Telehealth: Payer: Self-pay | Admitting: Internal Medicine

## 2015-07-17 NOTE — Telephone Encounter (Signed)
Patient asks PCP about ultrasound and lab results. Please, follow up (Spanish).

## 2015-07-17 NOTE — Telephone Encounter (Signed)
-----   Message from Palma HolterKanishka G Gunadasa, MD sent at 07/17/2015  8:57 AM EDT ----- Please call patient to let her know that her labs and ultrasound that we obtained to evaluate her pelvic pain are all normal. If she continues to have symptoms please ask her to try OTC pain medication (Tylenol or Ibuprofen) PRN. I think it is best if she returns to the urologist and/or GYN if her symptoms are still present. (Of note patient is spanish speaking and will need an interpreter)

## 2015-07-18 NOTE — Telephone Encounter (Signed)
Jeanne FritzBlanca [phone] with [employer] interpre[employer].  LM for patient to call back Penni Penado,CMA

## 2015-07-19 NOTE — Telephone Encounter (Signed)
Patient returned call requesting ultrasound and lab results.  Patient informed of normal results per PCP.  Jeanne Munoz

## 2015-07-27 ENCOUNTER — Encounter: Payer: Self-pay | Admitting: Internal Medicine

## 2015-07-27 ENCOUNTER — Ambulatory Visit (INDEPENDENT_AMBULATORY_CARE_PROVIDER_SITE_OTHER): Payer: 59 | Admitting: Internal Medicine

## 2015-07-27 VITALS — BP 98/60 | HR 70 | Temp 98.5°F | Wt 151.0 lb

## 2015-07-27 DIAGNOSIS — R1013 Epigastric pain: Secondary | ICD-10-CM | POA: Diagnosis not present

## 2015-07-27 MED ORDER — RANITIDINE HCL 150 MG PO CAPS: 150.0000 mg | ORAL_CAPSULE | Freq: Two times a day (BID) | ORAL | Status: DC | PRN

## 2015-07-27 NOTE — Assessment & Plan Note (Signed)
No concerning signs on exam or history. CBC and CMP in Feb 2017 unremarkable. Will trial Ranitidine for symptoms. Follow up if symptoms do not improve.

## 2015-07-27 NOTE — Patient Instructions (Signed)
Please try Ranitidine 1 tablet twice a day as needed for your abdominal discomfort. Please follow up in a month if your symptoms do not improve.

## 2015-07-27 NOTE — Progress Notes (Addendum)
Date of Visit: 07/27/2015   HPI: Spanish Interpreter used for visit [phone] Abdominal Pain: - reports of diffuse abdominal pain for the past 3-4 months.  - describes pain as hot or burning that only occurs at night - was seen prior to this for lower abdominal pain/pelvic pain and work up was unremarkable - reports that her symptoms have been keeping her from getting a good night's sleep for the past 2 weeks. She goes to bed at 10:30PM but is unable to fall asleep until 3AM - is having normal BMs; no blood or changes in color - no history of stomach ulcers or GER - reports that removing spicy foods in her diet has improved her symptoms a little - no nausea or vomiting - reports of sour taste in mouth in the morning at times  ROS: See HPI.  SH: no smoking history   PHYSICAL EXAM: BP 98/60 mmHg  Pulse 70  Temp(Src) 98.5 F (36.9 C) (Oral)  Wt 151 lb (68.493 kg)  SpO2 100%  LMP 06/30/2015 GEN: NAD CV: RRR, no murmurs, rubs, or gallops PULM: CTAB, normal effort ABD: Soft, nontender, nondistended, NABS, no organomegaly SKIN: No rash or cyanosis; warm and well-perfused; scalp- no overt bald patches  PSYCH: Mood and affect euthymic, normal rate and volume of speech NEURO: Awake, alert, no focal deficits grossly, normal speech  ASSESSMENT/PLAN:  Dyspepsia No concerning signs on exam or history. CBC and CMP in Feb 2017 unremarkable. Will trial Ranitidine for symptoms. Follow up if symptoms do not improve.   Palma HolterKanishka G Lavenia Stumpo, MD PGY 2 [facility]

## 2015-08-09 ENCOUNTER — Encounter: Payer: Self-pay | Admitting: Gastroenterology

## 2015-10-13 ENCOUNTER — Ambulatory Visit: Payer: 59 | Admitting: Gastroenterology

## 2016-07-23 ENCOUNTER — Encounter (HOSPITAL_COMMUNITY): Payer: Self-pay | Admitting: Emergency Medicine

## 2016-07-23 ENCOUNTER — Ambulatory Visit (HOSPITAL_COMMUNITY)
Admission: EM | Admit: 2016-07-23 | Discharge: 2016-07-23 | Disposition: A | Discharge status: Home / Self Care | Payer: 59

## 2016-07-23 DIAGNOSIS — J0140 Acute pansinusitis, unspecified: Secondary | ICD-10-CM | POA: Diagnosis not present

## 2016-07-23 DIAGNOSIS — R519 Headache, unspecified: Secondary | ICD-10-CM

## 2016-07-23 DIAGNOSIS — R51 Headache: Secondary | ICD-10-CM | POA: Diagnosis not present

## 2016-07-23 MED ORDER — NAPROXEN 500 MG PO TABS: 500.0000 mg | ORAL_TABLET | Freq: Two times a day (BID) | ORAL | 0 refills | Status: DC | PRN

## 2016-07-23 MED ORDER — AMOXICILLIN-POT CLAVULANATE 875-125 MG PO TABS: 1.0000 | ORAL_TABLET | Freq: Two times a day (BID) | ORAL | 0 refills | Status: AC

## 2016-07-23 NOTE — ED Provider Notes (Signed)
CSN: [id number]     Arrival date & time 07/23/16  1524 History   First MD Initiated Contact with Patient 07/23/16 1646     Chief Complaint  Patient presents with  . Headache   (Consider location/radiation/quality/duration/timing/severity/associated sxs/prior Treatment) 31 year old female presents with left sided facial pain and headache that started about 1 weeks ago. Has also noticed left nasal congestion with yellow to green mucus and left sided upper tooth pain. Denies any fever, sore throat, coughing or GI symptoms. Has noticed pain is worse at night when laying down on that side of her face. She has taken Ibuprofen with minimal relief. No other family members ill. No chronic health issues. Takes no daily medication.    The history is provided by the patient.    History reviewed. No pertinent past medical history. History reviewed. No pertinent surgical history. Family History  Problem Relation Age of Onset  . Diabetes Father    Social History  Substance Use Topics  . Smoking status: Never Smoker  . Smokeless tobacco: Never Used  . Alcohol use No   OB History    Gravida Para Term Preterm AB Living   2 2 2  0 0 2   SAB TAB Ectopic Multiple Live Births   0 0 0 0       Review of Systems  Constitutional: Positive for fatigue. Negative for appetite change, chills and fever.  HENT: Positive for congestion (left side only), rhinorrhea, sinus pain and sinus pressure. Negative for ear discharge, ear pain, facial swelling, mouth sores, nosebleeds, postnasal drip, sneezing, sore throat and trouble swallowing.   Eyes: Negative for pain, discharge, itching and visual disturbance.  Respiratory: Negative for cough, chest tightness, shortness of breath and wheezing.   Cardiovascular: Negative for chest pain.  Gastrointestinal: Negative for diarrhea, nausea and vomiting.  Musculoskeletal: Negative for arthralgias, back pain, myalgias, neck pain and neck stiffness.  Skin: Negative for rash  and wound.  Allergic/Immunologic: Negative for immunocompromised state.  Neurological: Positive for light-headedness and headaches. Negative for dizziness, tremors, seizures, syncope, speech difficulty, weakness and numbness.  Hematological: Negative for adenopathy. Does not bruise/bleed easily.    Allergies  Patient has no known allergies.  Home Medications   Prior to Admission medications   Medication Sig Start Date End Date Taking? Authorizing Provider  ibuprofen (ADVIL,MOTRIN) 200 MG tablet Take 200 mg by mouth every 6 (six) hours as needed.   Yes [provider]  amoxicillin-clavulanate (AUGMENTIN) 875-125 MG tablet Take 1 tablet by mouth every 12 (twelve) hours. 07/23/16 07/30/16  Sudie GrumblingAmyot, Latresa Gasser Berry, NP  naproxen (NAPROSYN) 500 MG tablet Take 1 tablet (500 mg total) by mouth 2 (two) times daily as needed for moderate pain or headache. 07/23/16   Daevon Holdren, Ali LoweAnn Berry, NP   Meds Ordered and Administered this Visit  Medications - No data to display  BP 116/71   Pulse 66   Temp 98.5 F (36.9 C) (Oral)   Resp 16   SpO2 99%  No data found.   Physical Exam  Constitutional: She is oriented to person, place, and time. She appears well-developed and well-nourished. No distress.  HENT:  Head: Normocephalic and atraumatic.  Right Ear: Hearing, tympanic membrane, external ear and ear canal normal.  Left Ear: Hearing, tympanic membrane, external ear and ear canal normal.  Nose: Rhinorrhea present. Right sinus exhibits no maxillary sinus tenderness and no frontal sinus tenderness. Left sinus exhibits maxillary sinus tenderness and frontal sinus tenderness.  Mouth/Throat: Uvula is midline,  oropharynx is clear and moist and mucous membranes are normal.  Eyes: Pupils are equal, round, and reactive to light. Conjunctivae and EOM are normal.  Neck: Normal range of motion. Neck supple.  Cardiovascular: Normal rate, regular rhythm and normal heart sounds.   No murmur heard. Pulmonary/Chest:  Effort normal and breath sounds normal. No respiratory distress. She has no wheezes.  Musculoskeletal: Normal range of motion.  Lymphadenopathy:    She has no cervical adenopathy.  Neurological: She is alert and oriented to person, place, and time. She has normal strength and normal reflexes. No cranial nerve deficit or sensory deficit. She displays a negative Romberg sign. Coordination normal.  Skin: Skin is warm and dry. Capillary refill takes less than 2 seconds. No rash noted.  Psychiatric: She has a normal mood and affect. Her behavior is normal. Judgment and thought content normal.    Urgent Care Course     Procedures (including critical care time)  Labs Review Labs Reviewed - No data to display  Imaging Review No results found.   Visual Acuity Review  Right Eye Distance:   Left Eye Distance:   Bilateral Distance:    Right Eye Near:   Left Eye Near:    Bilateral Near:         MDM   1. Acute pansinusitis, recurrence not specified   2. Sinus headache    Discussed with patient that she appears to have a left sided sinus infection. Recommend trial Augmentin 875mg  twice a day as directed. May take Naproxen 500mg  twice a day as needed for pain/headache. If any worsening of pain occurs, any face drop, numbness, change in vision or dizziness occurs, go to ER ASAP. Otherwise follow-up with your primary care provider if symptoms do not improve within 3 days.      Sudie Grumbling, NP 07/23/16 [phone]

## 2016-07-23 NOTE — ED Triage Notes (Signed)
Left side of head started hurting last Wednesday.  Patient has noticed at night, left side of face feels "asleep" , but during the day feels normal.  Left nostril with mucous, teeth on left side of mouth hurt

## 2016-07-23 NOTE — Discharge Instructions (Addendum)
Recommend start Augmentin 875mg  twice a day as directed. May take Naproxen 500mg  twice a day as needed for pain/headache. If any worsening of pain occurs, any face drop, numbness, change in vision or dizziness occurs, go to ER ASAP. Otherwise follow-up with your primary care provider if symptoms do not improve within 3 days.

## 2016-09-04 IMAGING — US US PELVIS COMPLETE
1 series · 15 of 25 positions shown · non-contrast
Comparison: None in PACs

CLINICAL DATA: Chronic pelvic pain; onset of last normal menstrual
period July 03, 2015

EXAM:
TRANSABDOMINAL AND TRANSVAGINAL ULTRASOUND OF PELVIS
TECHNIQUE: Both transabdominal and transvaginal ultrasound examinations of the
pelvis were performed. Transabdominal technique was performed for
global imaging of the pelvis including uterus, ovaries, adnexal
regions, and pelvic cul-de-sac. It was necessary to proceed with
endovaginal exam following the transabdominal exam to visualize the
uterus, endometrium, ovaries, and adnexal regions..

[Series 1: us pelvis complete · 15 of 65 slices shown]
[im 1/65]
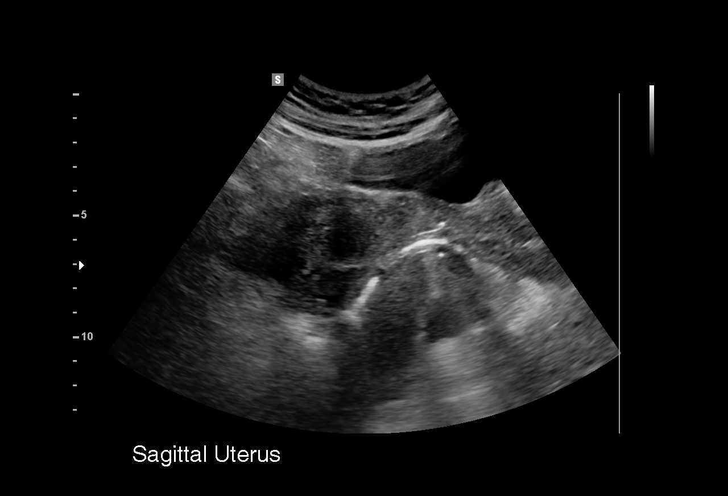
[im 6/65]
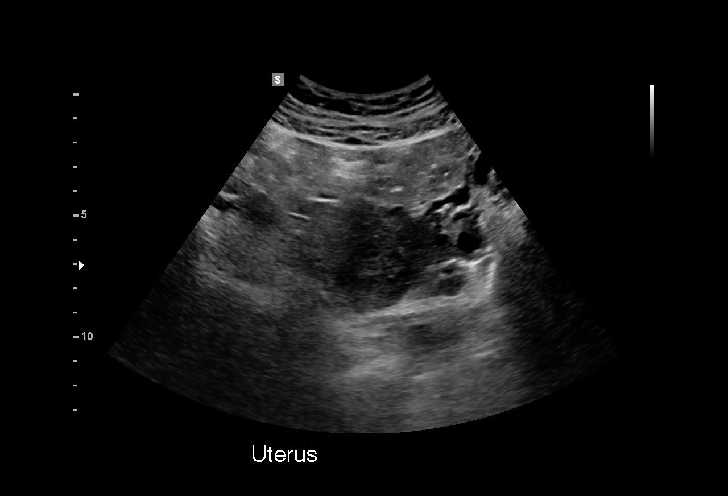
[im 11/65]
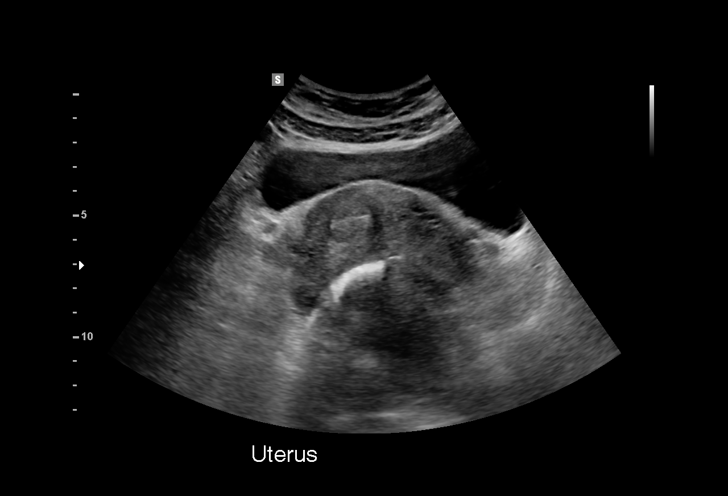
[im 14/65]
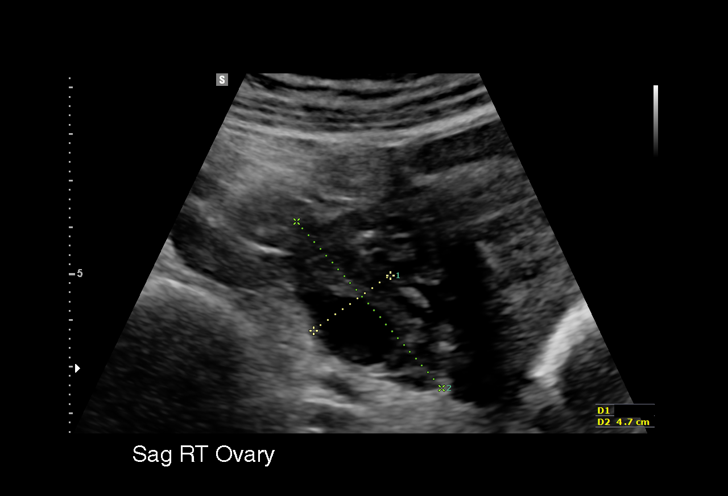
[im 19/65]
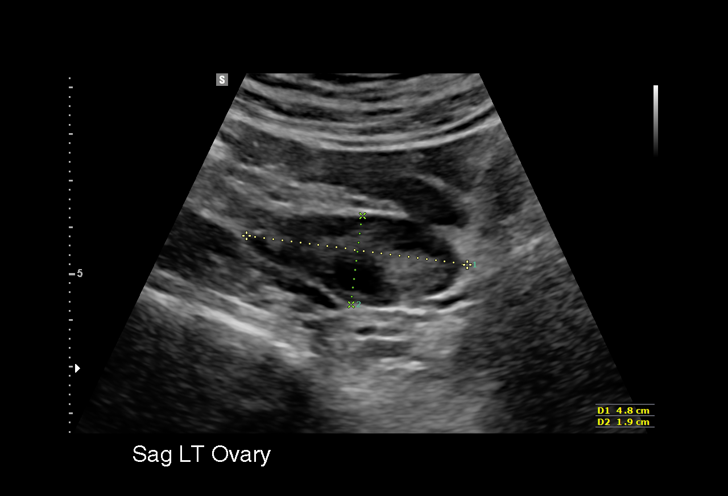
[im 25/65]
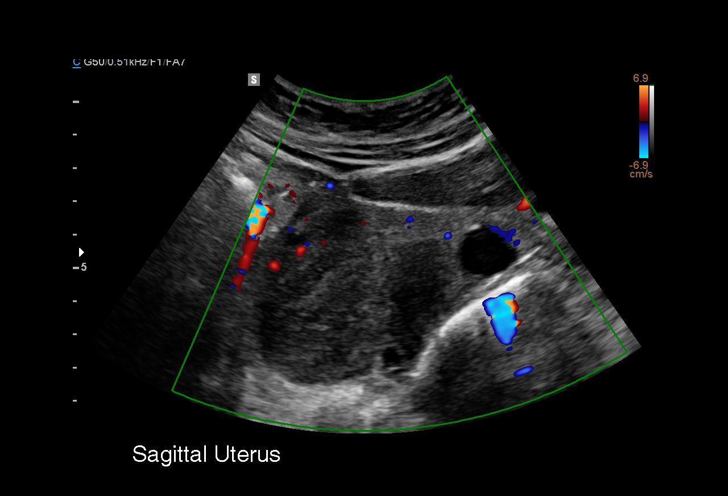
[im 27/65]
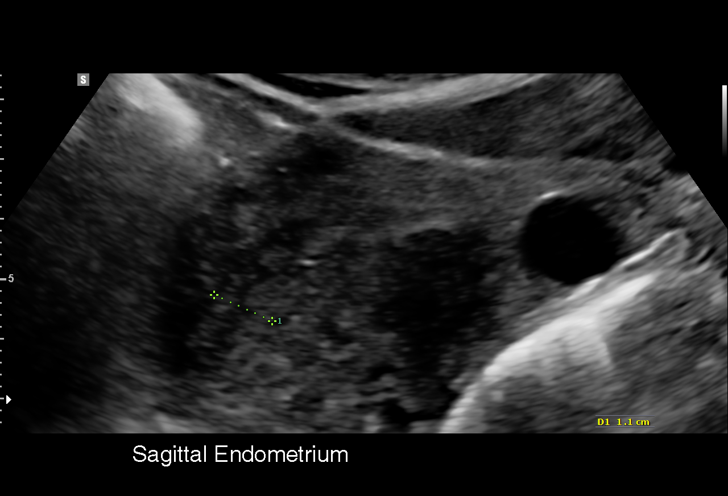
[im 33/65]
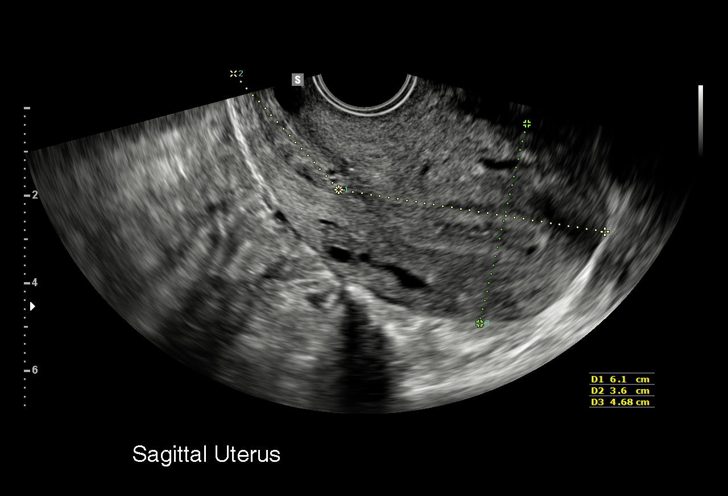
[im 38/65]
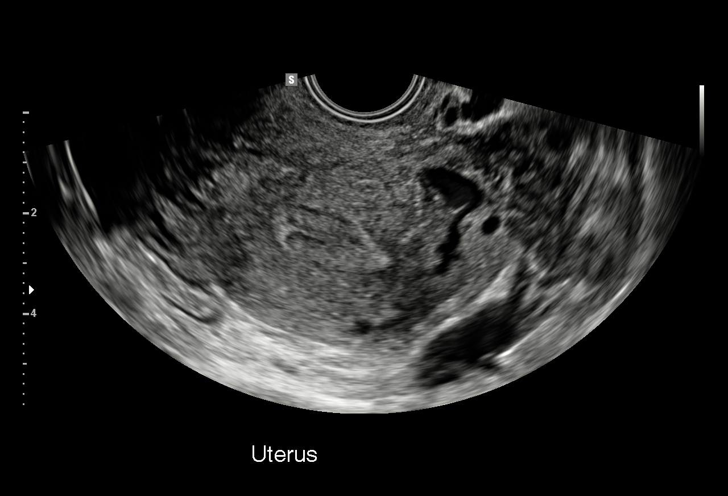
[im 41/65]
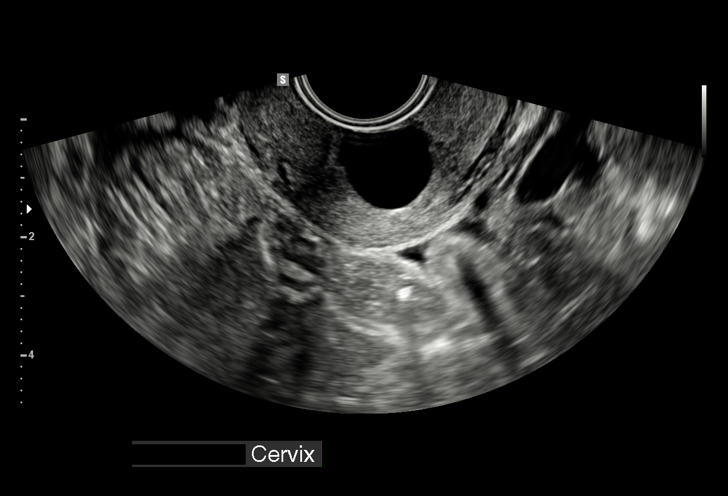
[im 46/65]
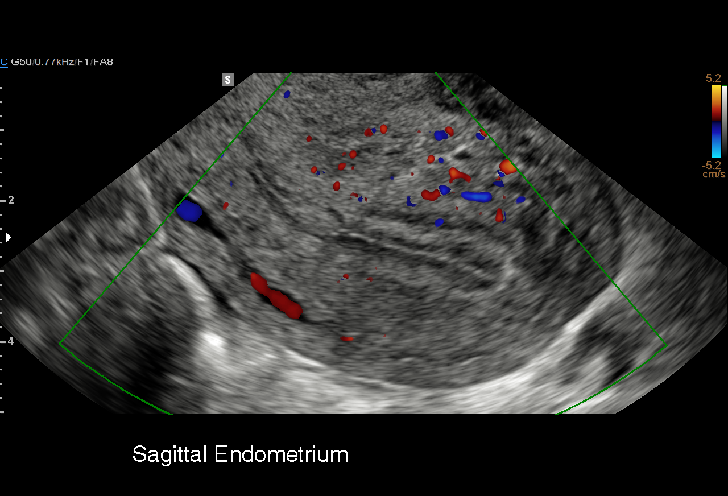
[im 51/65]
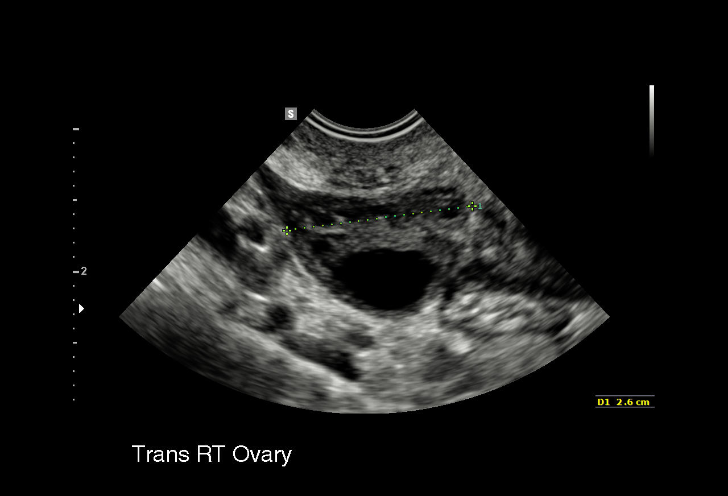
[im 54/65]
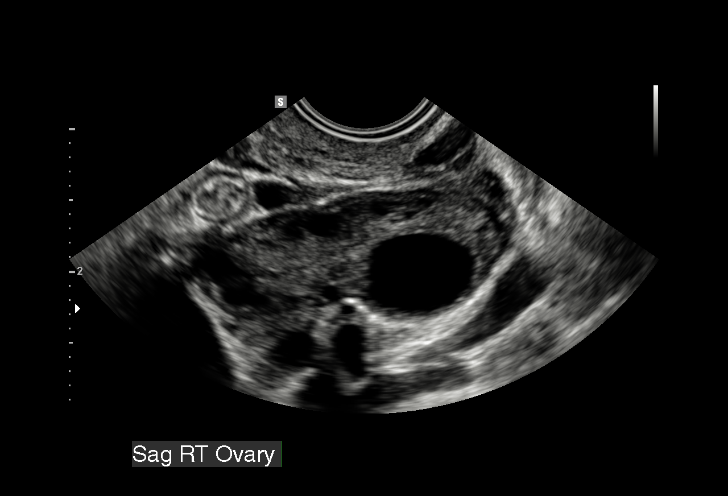
[im 59/65]
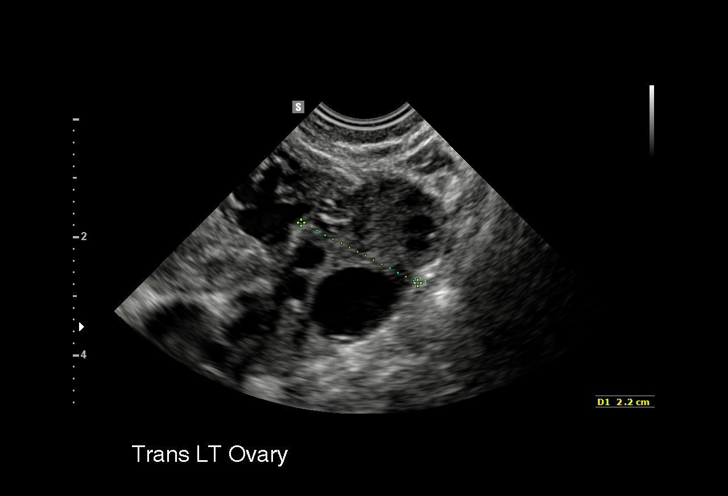
[im 65/65]
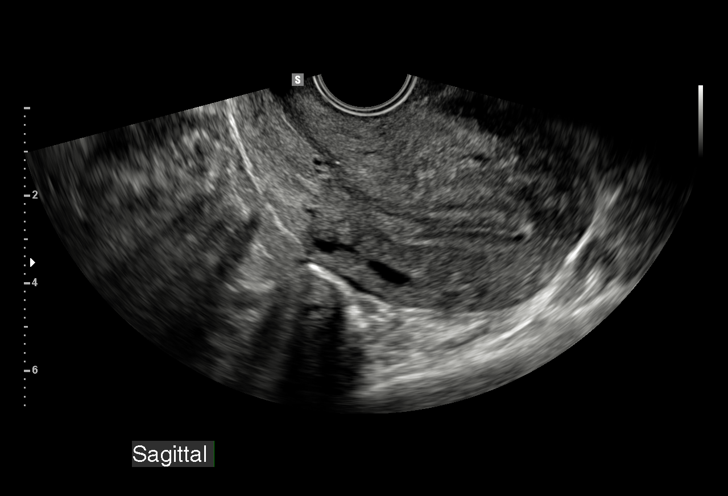

[15 of 25 positions shown; findings below may reference images not displayed]

FINDINGS: Uterus

Measurements: 9.7 x 4.7 x 6.5 cm. No fibroids or other mass
visualized.

Endometrium

Thickness: 10 mm.  No focal abnormality visualized.

Right ovary

Measurements: 4.5 x 1.9 x 2.6 cm. Normal appearance/no adnexal mass.

Left ovary

Measurements: 3.7 x 2.7 x 2.2 cm. Normal appearance/no adnexal mass.

Other findings

There is no free pelvic fluid.
IMPRESSION: 1. Normal appearance of the uterus and its endometrium.
2. Normal appearance of the ovaries and adnexal regions.
3. No free pelvic fluid.

## 2016-11-18 NOTE — Progress Notes (Signed)
   [facility] Phone: [phone]   Date of Visit: 11/19/2016   HPI:  Swollen Lymph nodes: - Patient reports that on November 5 noticed that she had 2 little "boils" on the left side of her neck.  She noticed that they got larger 2-3 days after this.  But after that she has noticed them decreasing in size.  She denies any pain with this but she describes that she has a "burning sensation" over that area. - She also reports that the left side of her head feels like it is under water when she sleeps on her left side.  She denies any ear pain or decreased hearing or tinnitus.  She denies having any headaches, blurred vision.  She takes Advil at night which helps her sleep on that side.  She denies any dizziness or lightheadedness. - He does not recall having cold-like symptoms including rhinorrhea, nasal congestion, sore throat..  She does report that her two kids have been sick this month with viral URIs. -Denies any fevers, chills, night sweats, unintentional weight loss.  No difficulty swallowing. -No family history of cancer.  ROS: See HPI.  PMFSH:  No significant past medical history  PHYSICAL EXAM: BP (!) 92/58   Pulse (!) 58   Temp 98.5 F (36.9 C) (Oral)   Wt 147 lb (66.7 kg)   LMP 11/08/2016   SpO2 98%   BMI 23.37 kg/m  GEN: NAD, nontoxic in appearance, pleasant HEENT: Atraumatic, normocephalic, neck supple with 2 palpable lymph nodes that are in the left posterior cervical chain.  These lymph nodes are smooth and soft and mobile and not particularly tender to palpation. Diameter about 0.5cm each. EOMI, sclera clear.  Right TM is normal in appearance.  Left ear canal with cerumen; after washout left TM appears to have clear effusion without any erythema or bulging.  The cone of light is slightly dull on the side. CV: RRR, no murmurs, rubs, or gallops PULM: CTAB, normal effort SKIN: No rash or cyanosis; warm and well-perfused PSYCH: Mood and affect  euthymic, normal rate and volume of speech NEURO: Awake, alert, no focal deficits grossly, normal speech   ASSESSMENT/PLAN:  Health maintenance: -Flu vaccine given today  Palpable lymph nodes:  Exam was not concerning.  These lymph nodes are smooth, regular, mobile, and about 0.5 cm in diameter.  Per patient the size of the lymph nodes are decreasing over time.  This is likely reactive lymphadenopathy.  She denies any URI symptoms.  However she does have sick contacts at home.  The effusion in her ear also supports a viral etiology. -Return to clinic if symptoms do not resolve in the next few weeks or so  Left ear effusion: The symptoms that she is feeling on the left side of her head while she sleeps on the left side is likely due to the effusion.  No signs of infection of the TM.  Reassured patient that this will self resolve.  Follow-up as needed  Palma HolterKanishka G Kirk Basquez, MD PGY 3 [facility]

## 2016-11-19 ENCOUNTER — Other Ambulatory Visit: Payer: Self-pay

## 2016-11-19 ENCOUNTER — Ambulatory Visit (INDEPENDENT_AMBULATORY_CARE_PROVIDER_SITE_OTHER): Payer: Commercial Indemnity | Admitting: Internal Medicine

## 2016-11-19 ENCOUNTER — Encounter: Payer: Self-pay | Admitting: Internal Medicine

## 2016-11-19 VITALS — BP 92/58 | HR 58 | Temp 98.5°F | Wt 147.0 lb

## 2016-11-19 DIAGNOSIS — R599 Enlarged lymph nodes, unspecified: Secondary | ICD-10-CM

## 2016-11-19 DIAGNOSIS — Z23 Encounter for immunization: Secondary | ICD-10-CM | POA: Diagnosis not present

## 2016-11-19 DIAGNOSIS — H65192 Other acute nonsuppurative otitis media, left ear: Secondary | ICD-10-CM

## 2016-11-21 ENCOUNTER — Ambulatory Visit: Payer: 59 | Admitting: Internal Medicine

## 2016-12-02 ENCOUNTER — Encounter: Payer: Self-pay | Admitting: Internal Medicine

## 2016-12-02 ENCOUNTER — Ambulatory Visit (INDEPENDENT_AMBULATORY_CARE_PROVIDER_SITE_OTHER): Payer: Commercial Indemnity | Admitting: Internal Medicine

## 2016-12-02 ENCOUNTER — Ambulatory Visit (HOSPITAL_COMMUNITY)
Admission: RE | Admit: 2016-12-02 | Discharge: 2016-12-02 | Disposition: A | Discharge status: Home / Self Care | Payer: Commercial Indemnity | Source: Ambulatory Visit | Attending: Family Medicine | Admitting: Family Medicine

## 2016-12-02 VITALS — BP 100/60 | HR 84 | Temp 98.1°F | Ht 66.5 in | Wt 149.0 lb

## 2016-12-02 DIAGNOSIS — R079 Chest pain, unspecified: Secondary | ICD-10-CM | POA: Diagnosis not present

## 2016-12-02 DIAGNOSIS — R002 Palpitations: Secondary | ICD-10-CM

## 2016-12-02 MED ORDER — NAPROXEN 500 MG PO TABS: 500.0000 mg | ORAL_TABLET | Freq: Every day | ORAL | 0 refills | Status: DC

## 2016-12-02 NOTE — Progress Notes (Signed)
   [facility] Phone: [phone]   Date of Visit: 12/02/2016   HPI:  Palpitations/Chest Pain/Headache:  -Reports that for the past 3 nights she has been feeling that her pulse is racing.  She feels her pulse at the sternal notch.  She has associated chest pain and feels like she cannot breathe during this time.  Additionally she also feels this burning sensation in her head that she describes as her headache.  She reports that her left clavicle hurts as well.  This only occurs at night when she goes to bed.  Her symptoms can last for hours.  -She denies any nausea, vomiting, blurred vision -She reports of a subjective fever Saturday night with sweating and itching of her chest.  But this has not happened again. -Denies any URI symptoms such as rhinorrhea, nasal congestion, sore throat.  Although she reports that she has slight throat discomfort when swallowing her saliva but again this only happens in the evening at bedtime. -No sick contacts -No history of smoking.  No family history of heart disease. -She has not done any new physical exercises especially of the upper body.  She does walk for regular exercise.  ROS: See HPI.  PMFSH:  No significant past medical history  PHYSICAL EXAM: BP 100/60 (BP Location: Right Arm, Patient Position: Sitting, Cuff Size: Normal)   Pulse 84   Temp 98.1 F (36.7 C) (Oral)   Ht 5' 6.5" (1.689 m)   Wt 149 lb (67.6 kg)   LMP 11/08/2016   SpO2 96%   BMI 23.69 kg/m  GEN: NAD, pleasant, nontoxic in appearance HEENT: Atraumatic, normocephalic, neck supple, EOMI, sclera clear  CV: RRR, no murmurs, rubs, or gallops.  The chest pain is reproducible to palpation of the chest wall mainly the left chest wall PULM: CTAB, normal effort ABD: Soft, nontender, nondistended, NABS, no organomegaly SKIN: No rash or cyanosis; warm and well-perfused EXTR: No lower extremity edema or calf tenderness PSYCH: Mood and affect euthymic, normal  rate and volume of speech NEURO: Awake, alert, no focal deficits grossly, normal speech  EKG: Normal sinus rhythm.  QTC is within normal limits.  No abnormalities noted.  ASSESSMENT/PLAN:  Palpitations/chest pain: Currently unclear in etiology.  It is odd that she only experiences it when she lays down to go to sleep in the evening.  Her EKG is unremarkable today.  Will obtain TSH and BMP.  Due to her age and lack of any risk factors it less likely that this is cardiac related.  She says that she does not have any stress or anxiety.  We will consider echo for further evaluation if blood work is normal.  As for her chest pain, this seems musculoskeletal in etiology.  Provided naproxen 500 mg twice daily as needed to take with food.  Unlikely this is PE or pericarditis or ACS.   Palma HolterKanishka G Gunadasa, MD PGY 3 [facility]

## 2016-12-02 NOTE — Patient Instructions (Signed)
Thank you for coming.   We will get blood work today. I will get in touch with you about the results.   Your EKG was normal.

## 2016-12-03 LAB — BASIC METABOLIC PANEL
BUN / CREAT RATIO: 16 (ref 9–23)
BUN: 12 mg/dL (ref 6–20)
CO2: 23 mmol/L (ref 20–29)
Calcium: 8.8 mg/dL (ref 8.7–10.2)
Chloride: 104 mmol/L (ref 96–106)
Creatinine, Ser: 0.77 mg/dL (ref 0.57–1.00)
GFR, EST AFRICAN AMERICAN: 118 mL/min/{1.73_m2} (ref >59)
GFR, EST NON AFRICAN AMERICAN: 102 mL/min/{1.73_m2} (ref >59)
Glucose: 93 mg/dL (ref 65–99)
Potassium: 3.7 mmol/L (ref 3.5–5.2)
SODIUM: 139 mmol/L (ref 134–144)

## 2016-12-03 LAB — TSH: TSH: 3.02 u[IU]/mL (ref 0.450–4.500)

## 2016-12-04 ENCOUNTER — Telehealth: Payer: Self-pay | Admitting: Internal Medicine

## 2016-12-04 DIAGNOSIS — R002 Palpitations: Secondary | ICD-10-CM

## 2016-12-04 NOTE — ED Notes (Signed)
Pt. Called and needed her lab work results.  Explained to her to call her PCP and they could explain her lab.  She verbalized understanding.  12/04/2016 17:09

## 2016-12-04 NOTE — Telephone Encounter (Signed)
Attempted to call patient to inform of normal blood work. Went to [employer]. Left message to call back.  I would like her to get an ECHO to further evaluate her palpitations if she is still having them. I have ordered an ECHO. Please get this set up for her.

## 2016-12-05 ENCOUNTER — Encounter: Payer: Self-pay | Admitting: Internal Medicine

## 2016-12-05 ENCOUNTER — Telehealth: Payer: Self-pay

## 2016-12-05 NOTE — Telephone Encounter (Signed)
[facility] at 1400 hrs with a show time of 1345. Called patient to inform her and she said that she understood and that time was agreeable.Glennie HawkSimpson, Jordie Schreur R

## 2016-12-05 NOTE — Telephone Encounter (Signed)
Appointment was made for patient on 12/13/2016 at 1400 hrs. Patient has been informed.Jeanne Munoz, Jeanne Munoz

## 2016-12-13 ENCOUNTER — Ambulatory Visit (HOSPITAL_COMMUNITY)
Admission: RE | Admit: 2016-12-13 | Discharge: 2016-12-13 | Disposition: A | Discharge status: Home / Self Care | Payer: Commercial Indemnity | Source: Ambulatory Visit | Attending: Family Medicine | Admitting: Family Medicine

## 2016-12-13 DIAGNOSIS — R002 Palpitations: Secondary | ICD-10-CM

## 2016-12-13 LAB — ECHOCARDIOGRAM COMPLETE
AVLVOTPG: 3 mmHg
CHL CUP DOP CALC LVOT VTI: 22.4 cm
CHL CUP MV DEC (S): 292
E/e' ratio: 4.74
EWDT: 292 ms
FS: 35 % (ref 28–44)
IVS/LV PW RATIO, ED: 0.76
LA ID, A-P, ES: 29 mm
LA diam end sys: 29 mm
LA diam index: 1.62 cm/m2
LA vol: 43.7 mL
LAVOLA4C: 37.1 mL
LAVOLIN: 24.5 mL/m2
LDCA: 3.46 cm2
LV E/e'average: 4.74
LV TDI E'LATERAL: 19.6
LVEEMED: 4.74
LVELAT: 19.6 cm/s
LVOT SV: 78 mL
LVOT diameter: 21 mm
LVOTPV: 92.7 cm/s
Lateral S' vel: 14 cm/s
MV Peak grad: 3 mmHg
MV pk E vel: 93 m/s
MVPKAVEL: 50.6 m/s
PW: 8.92 mm — AB (ref 0.6–1.1)
TAPSE: 27.3 mm
TDI e' medial: 10.1

## 2016-12-13 NOTE — Progress Notes (Signed)
  Echocardiogram 2D Echocardiogram has been performed.  Delcie RochENNINGTON, Ewan Grau 12/13/2016, 2:55 PM

## 2016-12-17 ENCOUNTER — Telehealth: Payer: Self-pay | Admitting: Internal Medicine

## 2016-12-17 NOTE — Telephone Encounter (Signed)
Called to discuss normal labs and normal ECHO from last visit. She reports that she has no more chest pain or palpitations. Naproxen did help with her symptoms. No indication to further evaluate for now as I believe her symptoms were MSK in nature. I asked her to let me know if her symptoms return.

## 2017-02-25 ENCOUNTER — Other Ambulatory Visit: Payer: Self-pay

## 2017-02-25 ENCOUNTER — Ambulatory Visit (INDEPENDENT_AMBULATORY_CARE_PROVIDER_SITE_OTHER): Payer: Commercial Indemnity | Admitting: Internal Medicine

## 2017-02-25 ENCOUNTER — Encounter: Payer: Self-pay | Admitting: Internal Medicine

## 2017-02-25 VITALS — BP 110/60 | HR 59 | Temp 97.6°F | Wt 152.0 lb

## 2017-02-25 DIAGNOSIS — R635 Abnormal weight gain: Secondary | ICD-10-CM | POA: Diagnosis not present

## 2017-02-25 DIAGNOSIS — Z0000 Encounter for general adult medical examination without abnormal findings: Secondary | ICD-10-CM

## 2017-02-25 DIAGNOSIS — Z711 Person with feared health complaint in whom no diagnosis is made: Secondary | ICD-10-CM | POA: Diagnosis not present

## 2017-02-25 NOTE — Patient Instructions (Signed)
1) Please try to add exercise to your schedule once a day for 30 minutes  2) try throat lozenges for your sore throat  Follow up in about 4 months

## 2017-02-25 NOTE — Progress Notes (Signed)
   [facility] Phone: [phone]   Date of Visit: 02/25/2017   HPI:  Checkup: -Patient has multiple concerns today that she would like to address -She reports that about 3 or 4 weeks ago she started having a sore throat.  She feels like her throa becomes dry very easily.  Her husband thinks that her voice has changed but she is unsure of this. -She has a history of a small lymph node that is sometimes uncomfortable.  This is located on the left neck.  We have obtain a CBC in the past without any abnormalities.  Patient reports that the lymph node has gone down in size since he first discussed this.  She states that she has some sensitivity over that area when she scrubs during her shower.  She reports this has been going on for a week -She also reports that she feels like she eats a lot and cannot help herself.  She has been eating more unhealthy foods which she has not done in the past.  She wonders why she has cravings for these types of foods.  She feels like she is gaining weight as well.  She reports that she has not exercised since has been cold outside.  During the warm weather she does go out to the park with her children.  No polyuria or polydipsia  ROS: See HPI.  PMFSH:  Past medical history: No significant past medical history  PHYSICAL EXAM: BP 110/60   Pulse (!) 59   Temp 97.6 F (36.4 C) (Oral)   Wt 152 lb (68.9 kg)   LMP 02/20/2017   SpO2 99%   BMI 24.17 kg/m  GEN: NAD HEENT: Atraumatic, normocephalic, neck supple without significant palpable lymph nodes.  The lymph node that she points out is very small and located in the left posterior cervical chain at the top and is less than 0.5 cm in diameter.  She reports that it is sensitive when I palpate that area, EOMI, sclera clear, oropharynx appears normal without any lesions, tonsillar swelling or exudates CV: RRR, no murmurs, rubs, or gallops PULM: CTAB, normal effort SKIN: No rash or cyanosis;  warm and well-perfused.  EXTR: No lower extremity edema or calf tenderness PSYCH: Mood and affect euthymic, normal rate and volume of speech NEURO: Awake, alert, no focal deficits grossly, normal speech   ASSESSMENT/PLAN:  Weight Gain:  Likely due to choosing more unhealthy food options and not exercising regularly.   Patient concern for sore throat: Her exam was normal there are no signs of any lesion or tonsillar swelling or exudates.  No lymphadenopathy noted on my exam.  Discussed using lozenges to soothe her throat and monitor for self resolution.  Overall I think Ms. Cato MulliganCardoza Estrada is a very healthy individual.  I do not think the symptoms that she brought up today I do any significant pathology.  I will follow-up with her in 4 months to see how things are going.   Palma HolterKanishka G Tanelle Lanzo, MD PGY 3 [facility]

## 2017-02-26 ENCOUNTER — Encounter: Payer: Self-pay | Admitting: Internal Medicine

## 2018-04-14 ENCOUNTER — Other Ambulatory Visit: Payer: Self-pay

## 2018-04-14 ENCOUNTER — Emergency Department (HOSPITAL_COMMUNITY): Payer: Commercial Indemnity

## 2018-04-14 ENCOUNTER — Emergency Department (HOSPITAL_COMMUNITY)
Admission: EM | Admit: 2018-04-14 | Discharge: 2018-04-14 | Disposition: A | Discharge status: Home / Self Care | Payer: Commercial Indemnity | Attending: Emergency Medicine | Admitting: Emergency Medicine

## 2018-04-14 ENCOUNTER — Telehealth (INDEPENDENT_AMBULATORY_CARE_PROVIDER_SITE_OTHER): Payer: Commercial Indemnity | Admitting: Family Medicine

## 2018-04-14 DIAGNOSIS — R079 Chest pain, unspecified: Secondary | ICD-10-CM

## 2018-04-14 DIAGNOSIS — R0602 Shortness of breath: Secondary | ICD-10-CM | POA: Diagnosis present

## 2018-04-14 DIAGNOSIS — G44209 Tension-type headache, unspecified, not intractable: Secondary | ICD-10-CM

## 2018-04-14 DIAGNOSIS — R51 Headache: Secondary | ICD-10-CM | POA: Diagnosis not present

## 2018-04-14 LAB — CBC WITH DIFFERENTIAL/PLATELET
Abs Immature Granulocytes: 0.01 10*3/uL (ref 0.00–0.07)
Basophils Absolute: 0 10*3/uL (ref 0.0–0.1)
Basophils Relative: 0 %
Eosinophils Absolute: 0 10*3/uL (ref 0.0–0.5)
Eosinophils Relative: 0 %
HCT: 35.9 % — ABNORMAL LOW (ref 36.0–46.0)
Hemoglobin: 11.5 g/dL — ABNORMAL LOW (ref 12.0–15.0)
Immature Granulocytes: 0 %
Lymphocytes Relative: 37 %
Lymphs Abs: 1.4 10*3/uL (ref 0.7–4.0)
MCH: 27.6 pg (ref 26.0–34.0)
MCHC: 32 g/dL (ref 30.0–36.0)
MCV: 86.3 fL (ref 80.0–100.0)
Monocytes Absolute: 0.3 10*3/uL (ref 0.1–1.0)
Monocytes Relative: 9 %
Neutro Abs: 2 10*3/uL (ref 1.7–7.7)
Neutrophils Relative %: 54 %
Platelets: 147 10*3/uL — ABNORMAL LOW (ref 150–400)
RBC: 4.16 MIL/uL (ref 3.87–5.11)
RDW: 13.5 % (ref 11.5–15.5)
WBC: 3.7 10*3/uL — ABNORMAL LOW (ref 4.0–10.5)
nRBC: 0 % (ref 0.0–0.2)

## 2018-04-14 LAB — BASIC METABOLIC PANEL
Anion gap: 9 (ref 5–15)
BUN: 10 mg/dL (ref 6–20)
CO2: 23 mmol/L (ref 22–32)
Calcium: 8.9 mg/dL (ref 8.9–10.3)
Chloride: 108 mmol/L (ref 98–111)
Creatinine, Ser: 0.68 mg/dL (ref 0.44–1.00)
GFR calc Af Amer: >60 mL/min (ref >60)
GFR calc non Af Amer: >60 mL/min (ref >60)
Glucose, Bld: 85 mg/dL (ref 70–99)
Potassium: 3.7 mmol/L (ref 3.5–5.1)
Sodium: 140 mmol/L (ref 135–145)

## 2018-04-14 LAB — I-STAT BETA HCG BLOOD, ED (MC, WL, AP ONLY): I-stat hCG, quantitative: <5 m[IU]/mL (ref <5)

## 2018-04-14 LAB — TROPONIN I: Troponin I: <0.03 ng/mL (ref <0.03)

## 2018-04-14 MED ORDER — PROCHLORPERAZINE EDISYLATE 10 MG/2ML IJ SOLN
10.0000 mg | Freq: Once | INTRAMUSCULAR | Status: AC
  Administered 2018-04-14: 16:00:00 10 mg via INTRAVENOUS
  Filled 2018-04-14: qty 2

## 2018-04-14 MED ORDER — HYDROXYZINE HCL 25 MG PO TABS: 25.0000 mg | ORAL_TABLET | Freq: Three times a day (TID) | ORAL | 0 refills | Status: DC | PRN

## 2018-04-14 MED ORDER — SODIUM CHLORIDE 0.9 % IV BOLUS
1000.0000 mL | Freq: Once | INTRAVENOUS | Status: AC
  Administered 2018-04-14: 16:00:00 1000 mL via INTRAVENOUS

## 2018-04-14 MED ORDER — ALBUTEROL SULFATE HFA 108 (90 BASE) MCG/ACT IN AERS
4.0000 | INHALATION_SPRAY | Freq: Once | RESPIRATORY_TRACT | Status: AC
  Administered 2018-04-14: 16:00:00 4 via RESPIRATORY_TRACT
  Filled 2018-04-14: qty 6.7

## 2018-04-14 MED ORDER — DIPHENHYDRAMINE HCL 50 MG/ML IJ SOLN
25.0000 mg | Freq: Once | INTRAMUSCULAR | Status: AC
  Administered 2018-04-14: 25 mg via INTRAVENOUS
  Filled 2018-04-14: qty 1

## 2018-04-14 MED ORDER — IBUPROFEN 600 MG PO TABS: 600.0000 mg | ORAL_TABLET | Freq: Three times a day (TID) | ORAL | 0 refills | Status: DC | PRN

## 2018-04-14 NOTE — ED Triage Notes (Addendum)
Pt reports was sent by PCP due to symptoms. Pt reports SOB and headache x5 days, worsened in past couple days. Pt denies any pain. Pt denies any fevers or cough. Pt denies recent travel or being around sick persons.

## 2018-04-14 NOTE — ED Notes (Signed)
Pt reports pain free, but sleepy at this time.  Ambulatory AROUND ROOM WITHOUT DISTRESS.   WILL CALL HUSBAND FOR RIDE.

## 2018-04-14 NOTE — Progress Notes (Signed)
[facility] Telemedicine Visit  Patient consented to have visit conducted via telephone.  Encounter participants: Patient: Jeanne Munoz  Provider: Mirian Mo  Others (if applicable): none Interpreter #: [phone] [employer]   Chief Complaint: headache  HPI:  Headache She reports that it began about about one week ago.  She reports that she sometimes has trouble sleeping because of the discomfort. The pain is describes as a dull pain over the whole head.  She reports that she is not able to hear from her right ear at times. It is also quite strong during today as well and she has tried taking medication for it without relief. Advil 200 mg Q6 hours and Nyquil at night. This medication did not improve the headache.  She suspects that she is having headaches due to her trouble breathing.  Difficulty breathing The trouble breathing started about two days following the start of her headache. She enjoys exercise and feels that she cannot perform the same exercises without SOB although those activities were easy several days ago.  She cannot do any exercise at all now.  It worsens with exertion and improves with rest.  Chest pressure She notices chest pressure starting about two days ago.  The pressure is not related to activity.  This does not feel similar to previous episodes of chest pressure. There does not seem to be any radiation to arm or jaw. When she feels the pressure lying down at night, she is able to sit up and feel better.  ROS: negative for fever and changes in energy. positive for palpitations. Negative for diarrhea or constipation.  Pertinent PMHx: previous chest pain without cardiac cause, dyspepsia.  Exam:  Respiratory: Speaking in long sentences without trouble.  No audible wheezing or coughing.  Assessment/Plan: Her current constellation of symptoms including chest pain, breathing difficulty and headache, is difficult to fully assess  her over the phone.  I think that the cause of the symptoms is not likely cardiac due to her young age and lack of cardiac history.  However, I cannot properly assess her over the phone think that it is prudent to have her assessed in the emergency department to ensure no cardiac or infectious cause for the above-mentioned symptoms.    Time spent on phone with patient: 35 minutes

## 2018-04-14 NOTE — ED Provider Notes (Signed)
[facility]   CSN: [id number] Arrival date & time: 04/14/18  1417    History   Chief Complaint No chief complaint on file.   HPI Jeanne Munoz is a 33 y.o. female.     HPI   33 yo F with PMHx dyspepsia here with headache, sensation of chest fullness, SOB. Pt reports that over the past several days, she has felt a "tightness" in her chest along with dry cough. It seems worse when she lies flat and she's had some associated sore throat when waking up in the AM as well. She has also felt like she's had a mild, aching, generalized HA, though she feels this could be from the stress of feeling like she can't breath. She denies any nausea, vomiting. No significant SOB persistently, though she does occasionally feel SOB lying flat. No other complaints. No recent travel. No h/o DVT/PE and she is not on blood thinners. No personal or family h/o CAD.   No past medical history on file.  Patient Active Problem List   Diagnosis Date Noted  . Dyspepsia 07/27/2015  . Chronic pelvic pain in female 07/05/2015    No past surgical history on file.   OB History    Gravida  2   Para  2   Term  2   Preterm  0   AB  0   Living  2     SAB  0   TAB  0   Ectopic  0   Multiple  0   Live Births               Home Medications    Prior to Admission medications   Medication Sig Start Date End Date Taking? Authorizing Provider  hydrOXYzine (ATARAX/VISTARIL) 25 MG tablet Take 1 tablet (25 mg total) by mouth every 8 (eight) hours as needed for anxiety. 04/14/18   Shaune PollackIsaacs, Janashia Parco, MD  ibuprofen (ADVIL,MOTRIN) 600 MG tablet Take 1 tablet (600 mg total) by mouth every 8 (eight) hours as needed for headache or moderate pain. 04/14/18   Shaune PollackIsaacs, Jakevious Hollister, MD    Family History Family History  Problem Relation Age of Onset  . Diabetes Father     Social History Social History   Tobacco Use  . Smoking status: Never Smoker  .  Smokeless tobacco: Never Used  Substance Use Topics  . Alcohol use: No  . Drug use: No     Allergies   Patient has no known allergies.   Review of Systems Review of Systems  Constitutional: Positive for fatigue. Negative for chills and fever.  HENT: Negative for congestion and rhinorrhea.   Eyes: Negative for visual disturbance.  Respiratory: Positive for chest tightness and shortness of breath. Negative for cough and wheezing.   Cardiovascular: Negative for chest pain and leg swelling.  Gastrointestinal: Negative for abdominal pain, diarrhea, nausea and vomiting.  Genitourinary: Negative for dysuria and flank pain.  Musculoskeletal: Negative for neck pain and neck stiffness.  Skin: Negative for rash and wound.  Allergic/Immunologic: Negative for immunocompromised state.  Neurological: Positive for weakness. Negative for syncope and headaches.  All other systems reviewed and are negative.    Physical Exam Updated Vital Signs BP 114/72 (BP Location: Left Arm)   Pulse 83   Temp 98.2 F (36.8 C) (Oral)   Resp 16   SpO2 99%   Physical Exam Vitals signs and nursing note reviewed.  Constitutional:      General: She  is not in acute distress.    Appearance: She is well-developed.  HENT:     Head: Normocephalic and atraumatic.  Eyes:     Conjunctiva/sclera: Conjunctivae normal.  Neck:     Musculoskeletal: Neck supple.  Cardiovascular:     Rate and Rhythm: Normal rate and regular rhythm.     Heart sounds: Normal heart sounds. No murmur. No friction rub.  Pulmonary:     Effort: Pulmonary effort is normal. No respiratory distress.     Breath sounds: Wheezing (scant, end-expiratory only) present. No rales.  Abdominal:     General: There is no distension.     Palpations: Abdomen is soft.     Tenderness: There is no abdominal tenderness.  Skin:    General: Skin is warm.     Capillary Refill: Capillary refill takes less than 2 seconds.  Neurological:     Mental Status:  She is alert and oriented to person, place, and time.     Motor: No abnormal muscle tone.      ED Treatments / Results  Labs (all labs ordered are listed, but only abnormal results are displayed) Labs Reviewed  CBC WITH DIFFERENTIAL/PLATELET - Abnormal; Notable for the following components:      Result Value   WBC 3.7 (*)    Hemoglobin 11.5 (*)    HCT 35.9 (*)    Platelets 147 (*)    All other components within normal limits  TROPONIN I  BASIC METABOLIC PANEL  I-STAT BETA HCG BLOOD, ED ([facility], [facility], [facility] ONLY)    EKG EKG Interpretation  Date/Time:  Tuesday April 14 2018 15:31:28 EDT Ventricular Rate:  67 PR Interval:    QRS Duration: 94 QT Interval:  425 QTC Calculation: 449 R Axis:   98 Text Interpretation:  Sinus rhythm Borderline right axis deviation Low voltage, precordial leads No significant change since last tracing Confirmed by Shaune Pollack [phone]) on 04/14/2018 4:29:23 PM   Radiology Dg Chest Portable 1 View  Result Date: 04/14/2018 CLINICAL DATA:  Shortness of breath EXAM: PORTABLE CHEST 1 VIEW COMPARISON:  None. FINDINGS: Lungs are clear. Heart size and pulmonary vascularity are normal. No adenopathy. No bone lesions. IMPRESSION: No abnormality noted. Electronically Signed   By: Bretta Bang III M.D.   On: 04/14/2018 15:29    Procedures Procedures (including critical care time)  Medications Ordered in ED Medications  albuterol (PROVENTIL HFA;VENTOLIN HFA) 108 (90 Base) MCG/ACT inhaler 4 puff (4 puffs Inhalation Given 04/14/18 1539)  sodium chloride 0.9 % bolus 1,000 mL (0 mLs Intravenous Stopped 04/14/18 1732)  prochlorperazine (COMPAZINE) injection 10 mg (10 mg Intravenous Given 04/14/18 1541)  diphenhydrAMINE (BENADRYL) injection 25 mg (25 mg Intravenous Given 04/14/18 1541)     Initial Impression / Assessment and Plan / ED Course  I have reviewed the triage vital signs and the nursing notes.  Pertinent labs & imaging results that were available during my  care of the patient were reviewed by me and considered in my medical decision making (see chart for details).        33 yo F here with SOB, headache. No fevers.  Re: SOB - this does seem to correlate w/ pollen exposure, and she has associated nasal congestion and post-nasal drainage - could be a component of mild RAD/allergies. DDx includes URI. Unlikely COVID clinically w no fevers. Will trial antihistamine and inhaler. EKG non-ischemic, trop neg - doubt ACS. She is not tachycardic, hypoxic, and is satting well on RA, PERC neg -  doubt PE. No signs of PNA or PTX on CXR.   Re: HA - I suspect this is 2/2 above, possible viral illness. No neck stiffness or signs of meningitis or encephalitis. Improved here. No focal neuro deficits. Could be stress related as well given increased stressors preceding these sx.  Will trial atarax, also inhaler and give good return precautions.    Final Clinical Impressions(s) / ED Diagnoses   Final diagnoses:  Shortness of breath  Acute non intractable tension-type headache    ED Discharge Orders         Ordered    hydrOXYzine (ATARAX/VISTARIL) 25 MG tablet  Every 8 hours PRN     04/14/18 1639    ibuprofen (ADVIL,MOTRIN) 600 MG tablet  Every 8 hours PRN     04/14/18 1639           Shaune Pollack, MD 04/14/18 1754

## 2018-04-14 NOTE — Progress Notes (Signed)
I agree with Dr. Frank's assessment. 

## 2018-04-14 NOTE — Discharge Instructions (Addendum)
I suspect your symptoms are from multiple issues:  1.) I think you have a component of allergies. I'd recommend using the inhaler every 4-6 hours as needed for shortness of breath or chest tightness. You can also start an over-the-counter medication like Claritin or other allergy medicine to help.  2.) You may also have a component of stressors. I've prescribed atarax, which is a non-addicting medicine to help with sleep and anxiety. Try this to see ifit helps.  3.) Drink plenty of fluids. If you develop high fevers or worsening shortness of breath, return to the ER.

## 2018-05-13 ENCOUNTER — Encounter: Payer: Self-pay | Admitting: Family Medicine

## 2018-05-13 ENCOUNTER — Ambulatory Visit (INDEPENDENT_AMBULATORY_CARE_PROVIDER_SITE_OTHER): Payer: Commercial Indemnity | Admitting: Family Medicine

## 2018-05-13 ENCOUNTER — Other Ambulatory Visit: Payer: Self-pay

## 2018-05-13 DIAGNOSIS — G4700 Insomnia, unspecified: Secondary | ICD-10-CM | POA: Diagnosis not present

## 2018-05-13 NOTE — Assessment & Plan Note (Signed)
Likely multifactorial.  Suspect stress and anxiety is contributing although GAD and PHQ 9 are unrevealing.  Given her propensity for overthinking and anxiety surrounding her brother's recent separation may be contributing to inability to "shut her brain off" at night and would also help explain possible tension headaches.  No abnormalities on neurological exam to suggest alternative concerning etiology.  No signs concerning for sleep apnea.  Reviewed importance of proper sleep hygiene and possible need for noise canceling or white noise machine given she is easily startled by noises at night.  Can continue melatonin and increase to 2 tablets if not noticing much effect.  Follow-up in 2 to 4 weeks if still symptomatic.

## 2018-05-13 NOTE — Patient Instructions (Signed)
It was great to see you!  Our plans for today:  - Continue to take the melatonin, you can take two of these tablets if one is not helping. - Cut off all screens including TV, phone 2 hours before bedtime. - Cut out all caffeine after 5pm - If you are in bed and not falling asleep, get out of bed and do a relaxing activity like reading until you are sleepy. - Try to limit naps during the day. - Try sleeping with a fan or noise cancelling machine. - If you are still having difficulties in about a month, come back to see [place].  Take care and seek immediate care sooner if you develop any concerns.   Dr. [facility]

## 2018-05-13 NOTE — Progress Notes (Signed)
Subjective:   Patient ID: Jeanne Munoz    DOB: [birth year], 33 y.o. female   MRN: [id number]  Jeanne Munoz is a 33 y.o. female with a history of dyspepsia, chronic pelvic pain here for   Trouble sleeping, headaches - seen in ED 4/7 with headache, chest fullness and SOB, thought to be 2/2 viral illness vs stress. Gave atarax, ibuprofen.  Not having chest fullness or shortness of breath any more, feeling better. -For the past month has been having difficulties falling asleep and maintaining sleep.  - Will drink herbal tea and listen to music before bed although does report watching TV. -States she feels like she will sometimes be asleep for about an hour or less, will hear a noise that startles her and will get up.  Around this time she sometimes will have a headache located around the top of her head that is burning in character.  She will take ibuprofen for this and it will go away.  She feels like she sleeps maybe 20 minutes during the night but does not suffer from daytime sleepiness. -She has tried putting pillows over her ears but still will get startled easily.  Husband snores at night. -Husband has not noticed patient snoring at night. - has tried melatonin but doesn't feel it has been helping. -She does not take naps during the day -Her brother and his wife recently separated a little over a month ago and has been thinking a lot about this but states that she does not feel anxious about this anymore. -Tried Atarax that she got from the ED but states it makes her feel more antsy so she is not taking anymore. -She goes to get ready for bed around 10 PM, this is when she takes melatonin 10 mg, and then lays down for bed around 10:20 PM - no vision changes, weakness of extremities.  Review of Systems:  Per HPI.  PMFSH, medications and smoking status reviewed.  Objective:   BP 105/70   Pulse 70   Wt 149 lb (67.6 kg)   LMP 05/02/2018   SpO2 95%   BMI 23.69 kg/m  Vitals  and nursing note reviewed.  General: well nourished, well developed, in no acute distress with non-toxic appearance HEENT: normocephalic, atraumatic, moist mucous membranes CV: regular rate and rhythm without murmurs, rubs, or gallops, no lower extremity edema Lungs: clear to auscultation bilaterally with normal work of breathing Skin: warm, dry, no rashes or lesions Extremities: warm and well perfused, normal tone MSK: Full ROM, strength 5/5 to U/LE bilaterally, normal gait.  No edema.  Neuro: Alert and oriented, speech normal.  PERRL, Extraocular movements intact.  Intact symmetric sensation to light touch of face and extremities bilaterally.  Hearing grossly intact bilaterally.  Tongue protrudes normally with no deviation.  Shoulder shrug, smile symmetric. Finger to nose normal.  GAD 7 : Generalized Anxiety Score 05/13/2018  Nervous, Anxious, on Edge 0  Control/stop worrying 0  Worry too much - different things 0  Trouble relaxing 1  Restless 0  Easily annoyed or irritable 1  Afraid - awful might happen 0  Total GAD 7 Score 2    Depression screen [facility] 2/9 05/13/2018 05/13/2018 02/25/2017  Decreased Interest 1 0 0  Down, Depressed, Hopeless 0 0 0  PHQ - 2 Score 1 0 0  Altered sleeping 3 - -  Tired, decreased energy 2 - -  Change in appetite 2 - -  Feeling bad or failure about yourself  0 - -  Trouble concentrating 1 - -  Moving slowly or fidgety/restless 0 - -  Suicidal thoughts 0 - -  PHQ-9 Score 9 - -  Difficult doing work/chores Somewhat difficult - -    Assessment & Plan:   Insomnia Likely multifactorial.  Suspect stress and anxiety is contributing although GAD and PHQ 9 are unrevealing.  Given her propensity for overthinking and anxiety surrounding her brother's recent separation may be contributing to inability to "shut her brain off" at night and would also help explain possible tension headaches.  No abnormalities on neurological exam to suggest alternative concerning  etiology.  No signs concerning for sleep apnea.  Reviewed importance of proper sleep hygiene and possible need for noise canceling or white noise machine given she is easily startled by noises at night.  Can continue melatonin and increase to 2 tablets if not noticing much effect.  Follow-up in 2 to 4 weeks if still symptomatic.  No orders of the defined types were placed in this encounter.  No orders of the defined types were placed in this encounter.   Ellwood Dense, DO PGY-2, [facility] 05/13/2018 5:05 PM

## 2019-01-08 NOTE — L&D Delivery Note (Addendum)
OB/GYN Faculty Practice Delivery Note  Jeanne Munoz is a 34 y.o. [id number] s/p SVD at [redacted]w[redacted]d. She was admitted for SOL.   ROM: 3h 95m with clear fluid GBS Status:  Negative/-- (02/12 0000) Maximum Maternal Temperature: 98.66F  Labor Progress: . Patient presented to L&D for SOL. Initial SVE: 6/60/-3. Patient received AROM and progressed to complete.   Delivery Date/Time: 03/03/2019 at 1306 Delivery: Called to room and patient was complete and pushing. Head delivered LOA. No nuchal cord present. Shoulder and body delivered in usual fashion. Infant with spontaneous cry, placed on mother's abdomen, dried and stimulated. Cord clamped x 2 after 1-minute delay, and cut by father. Cord blood drawn. Placenta delivered spontaneously with gentle cord traction. Fundus firm with massage and Pitocin. Labia, perineum, vagina, and cervix inspected inspected with 1st degree perineal laceration which was repaired with 3-0 Vicryl in a standard fashion. Baby Weight: pending  Placenta: Sent to L&D Complications: None Lacerations: 1st degree EBL: 121 mL Analgesia: Epidural   Infant: APGAR (1 MIN): 9   APGAR (5 MINS): 9   APGAR (10 MINS):     Jackelyn Poling, DO Resident, [facility] 03/03/2019, 1:40 PM  OB FELLOW DELIVERY ATTESTATION  I was gloved and present for the delivery in its entirety, and I agree with the above resident's note.    Jerilynn Birkenhead, MD [facility] Fellow, [facility] for [employer], [facility]

## 2019-01-21 ENCOUNTER — Inpatient Hospital Stay (HOSPITAL_COMMUNITY)
Admission: AD | Admit: 2019-01-21 | Discharge: 2019-01-22 | Disposition: A | Discharge status: Home / Self Care | Payer: Commercial Indemnity | Attending: Obstetrics and Gynecology | Admitting: Obstetrics and Gynecology

## 2019-01-21 ENCOUNTER — Other Ambulatory Visit: Payer: Self-pay

## 2019-01-21 ENCOUNTER — Encounter (HOSPITAL_COMMUNITY): Payer: Self-pay | Admitting: Obstetrics and Gynecology

## 2019-01-21 DIAGNOSIS — Z3A34 34 weeks gestation of pregnancy: Secondary | ICD-10-CM | POA: Diagnosis not present

## 2019-01-21 DIAGNOSIS — R1084 Generalized abdominal pain: Secondary | ICD-10-CM | POA: Insufficient documentation

## 2019-01-21 DIAGNOSIS — R141 Gas pain: Secondary | ICD-10-CM | POA: Diagnosis not present

## 2019-01-21 DIAGNOSIS — O26893 Other specified pregnancy related conditions, third trimester: Secondary | ICD-10-CM | POA: Diagnosis present

## 2019-01-21 DIAGNOSIS — K219 Gastro-esophageal reflux disease without esophagitis: Secondary | ICD-10-CM

## 2019-01-21 HISTORY — DX: Other specified health status: Z78.9

## 2019-01-21 LAB — URINALYSIS, ROUTINE W REFLEX MICROSCOPIC
Bilirubin Urine: NEGATIVE
Glucose, UA: NEGATIVE mg/dL
Hgb urine dipstick: NEGATIVE
Ketones, ur: NEGATIVE mg/dL
Nitrite: NEGATIVE
Protein, ur: NEGATIVE mg/dL
Specific Gravity, Urine: 1.009 (ref 1.005–1.030)
pH: 7 (ref 5.0–8.0)

## 2019-01-21 MED ORDER — LIDOCAINE VISCOUS HCL 2 % MT SOLN
15.0000 mL | Freq: Once | OROMUCOSAL | Status: AC
  Administered 2019-01-21: 15 mL via ORAL
  Filled 2019-01-21: qty 15

## 2019-01-21 MED ORDER — ALUM & MAG HYDROXIDE-SIMETH 200-200-20 MG/5ML PO SUSP
30.0000 mL | Freq: Once | ORAL | Status: AC
  Administered 2019-01-21: 30 mL via ORAL
  Filled 2019-01-21: qty 30

## 2019-01-21 MED ORDER — HYOSCYAMINE SULFATE 0.125 MG SL SUBL
0.1250 mg | SUBLINGUAL_TABLET | Freq: Once | SUBLINGUAL | Status: AC
  Administered 2019-01-21: 23:00:00 0.125 mg via SUBLINGUAL
  Filled 2019-01-21: qty 1

## 2019-01-21 MED ORDER — PANTOPRAZOLE SODIUM 20 MG PO TBEC: 20.0000 mg | DELAYED_RELEASE_TABLET | Freq: Every day | ORAL | 0 refills | Status: DC | PRN

## 2019-01-21 NOTE — MAU Provider Note (Signed)
Chief Complaint:  Abdominal Pain   First Provider Initiated Contact with Patient 01/21/19 2248     HPI: Jeanne Munoz is a 34 y.o. G3P2002 at 85w0dwho presents to maternity admissions reporting upper abdominal cramping with gas pains across upper abdomen.  Lots of flatus today.  Does not feel like contractions.. She reports good fetal movement, denies LOF, vaginal bleeding, vaginal itching/burning, urinary symptoms, h/a, dizziness, n/v, diarrhea, constipation or fever/chills.    Abdominal Pain This is a new problem. The current episode started yesterday. The onset quality is gradual. The problem occurs intermittently. The problem has been waxing and waning. The pain is located in the epigastric region. The quality of the pain is colicky. The abdominal pain does not radiate. Associated symptoms include flatus. Pertinent negatives include no anorexia, constipation, diarrhea, dysuria, fever, frequency, headaches, myalgias, nausea or vomiting. The pain is aggravated by eating. The pain is relieved by passing flatus. She has tried nothing for the symptoms.    RN Note: Having upper abd pain radiates down side of abd, feels like bowels are upset, started last night at 7 pm.  Yesterday, had ate chicken and a salad at 4:30 pm. Denies diarrhea or constipation.  Last bm was after lunch, describes as normal.  Felt the pain less today then last night.  Felt it twice this morning but starting feeling more often this afternoon, but now hasn't felt in approx an hour. The pain is stronger then a contraction.  No bleeding. No leaking.  Baby moving well.    Past Medical History: Past Medical History:  Diagnosis Date  . Medical history non-contributory     Past obstetric history: OB History  Gravida Para Term Preterm AB Living  3 2 2  0 0 2  SAB TAB Ectopic Multiple Live Births  0 0 0 0 2    # Outcome Date GA Lbr Len/2nd Weight Sex Delivery Anes PTL Lv  3 Current           2 Term      Vag-Spont   LIV   1 Term      Vag-Spont   LIV    Past Surgical History: Past Surgical History:  Procedure Laterality Date  . NO PAST SURGERIES      Family History: Family History  Problem Relation Age of Onset  . Diabetes Father     Social History: Social History   Tobacco Use  . Smoking status: Never Smoker  . Smokeless tobacco: Never Used  Substance Use Topics  . Alcohol use: No  . Drug use: No    Allergies: No Known Allergies  Meds:  Medications Prior to Admission  Medication Sig Dispense Refill Last Dose  . prenatal vitamin w/FE, FA (PRENATAL 1 + 1) 27-1 MG TABS tablet Take 1 tablet by mouth daily at 12 noon.   01/20/2019 at Unknown time  . hydrOXYzine (ATARAX/VISTARIL) 25 MG tablet Take 1 tablet (25 mg total) by mouth every 8 (eight) hours as needed for anxiety. 20 tablet 0   . ibuprofen (ADVIL,MOTRIN) 600 MG tablet Take 1 tablet (600 mg total) by mouth every 8 (eight) hours as needed for headache or moderate pain. 20 tablet 0     I have reviewed patient's Past Medical Hx, Surgical Hx, Family Hx, Social Hx, medications and allergies.   ROS:  Review of Systems  Constitutional: Negative for fever.  Gastrointestinal: Positive for abdominal pain and flatus. Negative for anorexia, constipation, diarrhea, nausea and vomiting.  Genitourinary: Negative for dysuria  and frequency.  Musculoskeletal: Negative for myalgias.  Neurological: Negative for headaches.   Other systems negative  Physical Exam   Patient Vitals for the past 24 hrs:  BP Temp Temp src Pulse Resp SpO2 Height Weight  01/21/19 2232 -- -- -- -- -- -- 5\' 6"  (1.676 m) 85.7 kg  01/21/19 2231 -- -- -- -- -- 98 % -- --  01/21/19 2230 119/65 98.2 F (36.8 C) Oral 94 18 -- -- --  01/21/19 2226 -- -- -- -- -- 98 % -- --  01/21/19 2221 -- -- -- -- -- 98 % -- --  01/21/19 2216 -- -- -- -- -- 98 % -- --   Constitutional: Well-developed, well-nourished female in no acute distress.  Cardiovascular: normal rate and  rhythm Respiratory: normal effort, clear to auscultation bilaterally GI: Abd soft, non-tender, gravid appropriate for gestational age.   No rebound or guarding. MS: Extremities nontender, no edema, normal ROM Neurologic: Alert and oriented x 4.  GU: Neg CVAT.  PELVIC EXAM: deferred   FHT:  Baseline 140 , moderate variability, accelerations present, no decelerations Contractions:  Rare   Labs: Results for orders placed or performed during the hospital encounter of 01/21/19 (from the past 24 hour(s))  Urinalysis, Routine w reflex microscopic     Status: Abnormal   Collection Time: 01/21/19 10:08 PM  Result Value Ref Range   Color, Urine YELLOW YELLOW   APPearance HAZY (A) CLEAR   Specific Gravity, Urine 1.009 1.005 - 1.030   pH 7.0 5.0 - 8.0   Glucose, UA NEGATIVE NEGATIVE mg/dL   Hgb urine dipstick NEGATIVE NEGATIVE   Bilirubin Urine NEGATIVE NEGATIVE   Ketones, ur NEGATIVE NEGATIVE mg/dL   Protein, ur NEGATIVE NEGATIVE mg/dL   Nitrite NEGATIVE NEGATIVE   Leukocytes,Ua TRACE (A) NEGATIVE   RBC / HPF 0-5 0 - 5 RBC/hpf   WBC, UA 0-5 0 - 5 WBC/hpf   Bacteria, UA RARE (A) NONE SEEN   Squamous Epithelial / LPF 0-5 0 - 5   Mucus PRESENT     Imaging:  No results found.  MAU Course/MDM: I have ordered labs and reviewed results.  NST reviewed and is reassuring.  Treatments in MAU included Levsin and GI cocktail  These provided good relief of symptoms DIscussed diet.  Fiber and other foods can produce gas which may be source of pain Will Rx PRN protonix for acid reflux.    Assessment: Single IUP at [redacted]w[redacted]d Colicky abdominal pain Possible GERD  Plan: Discharge home Rx Protonix for PRN used for reflux Preterm Labor precautions and fetal kick counts Follow up in Office for prenatal visits   Encouraged to return here or to other Urgent Care/ED if she develops worsening of symptoms, increase in pain, fever, or other concerning symptoms.   Pt stable at time of  discharge.  [redacted]w[redacted]d CNM, MSN Certified Nurse-Midwife 01/21/2019 10:49 PM

## 2019-01-21 NOTE — MAU Note (Signed)
Having upper abd pain radiates down side of abd, feels like bowels are upset, started last night at 7 pm.  Yesterday, had ate chicken and a salad at 4:30 pm. Denies diarrhea or constipation.  Last bm was after lunch, describes as normal.  Felt the pain less today then last night.  Felt it twice this morning but starting feeling more often this afternoon, but now hasn't felt in approx an hour. The pain is stronger then a contraction.  No bleeding. No leaking.  Baby moving well.

## 2019-01-21 NOTE — Discharge Instructions (Signed)
Opciones de alimentos para pacientes adultos con enfermedad de reflujo gastroesofgico Food Choices for Gastroesophageal Reflux Disease, Adult Si tiene enfermedad de reflujo gastroesofgico (ERGE), los alimentos que consume y los hbitos de alimentacin son muy importantes. Elegir los alimentos adecuados puede ayudar a [employer]. Piense en consultar a un especialista en nutricin (nutricionista) para que lo ayude a Associate Professor. Consejos para seguir [employer] plan  Comidas  Elija alimentos saludables con bajo contenido de grasa, como frutas, verduras, cereales integrales, productos lcteos descremados y carne [place] de [place], de pescado y de [place].  Haga comidas pequeas durante Glass blower/designer de 3 comidas abundantes. Coma lentamente y en un lugar donde est distendido. Evite agacharse o recostarse hasta 2 o 3horas despus de haber comido.  Evite comer 2 a 3horas antes de ir a acostarse.  Evite beber grandes cantidades de lquidos con las comidas.  Evite frer los alimentos a la hora de la coccin. Puede hornear, grillar o asar a la parrilla.  Evite o limite la cantidad de: ? Chocolate. ? Menta y mentol. ? Alcohol. ? Pimienta. ? Caf negro y descafeinado. ? T negro y descafeinado. ? Bebidas con gas (gaseosas). ? Bebidas energizantes y refrescos que contengan cafena.  Limite los alimentos con alto contenido de [place], por ejemplo: ? Carnes grasas o alimentos fritos. ? Leche entera, crema, manteca o helado. ? Nueces y [place] de frutos secos. ? Pastelera, donas y dulces hechos con [place] o [place].  Evite los alimentos que le ocasionen sntomas. Estos pueden ser distintos para Advertising account planner. Los alimentos que suelen causan sntomas son los siguientes: ? Haematologist. ? Naranjas, limones y limas. ? Pimientos. ? Comidas condimentadas. ? [place] y Kae Heller. ? Vinagre. Estilo de vida  Mantenga un peso saludable. Pregntele a su mdico cul es el peso saludable  para usted. Si necesita perder peso, hable con su mdico para hacerlo de manera segura.  Realice actividad fsica durante, al menos, 30 minutos 5 das por semana o ms, o segn lo indicado por su mdico.  Use ropa suelta.  No fume. Si necesita ayuda para dejar de fumar, consulte al mdico.  Duerma con la cabecera de la cama ms elevada que los pies. Use una cua debajo del colchn o bloques debajo del armazn de la cama para Pharmacologist la cabecera de la cama elevada. Resumen  Si tiene enfermedad de reflujo gastroesofgico (ERGE), las elecciones de alimentos y el [place] de vida son muy importantes para ayudar a Paramedic los sntomas.  Haga comidas pequeas durante Glass blower/designer de 3 comidas abundantes. Coma lentamente y en un lugar donde est distendido.  Limite los alimentos con alto contenido graso como la carne grasa o los alimentos fritos.  Evite agacharse o recostarse hasta 2 o 3horas despus de haber comido.  Evite la menta y [place] buena, la cafena, el alcohol y el chocolate. Esta informacin no tiene Theme park manager el consejo del mdico. Asegrese de hacerle al mdico cualquier pregunta que tenga. Document Revised: 07/30/2016 Document Reviewed: 07/30/2016 Elsevier Patient Education  2020 Elsevier Inc. Distensin abdominal Abdominal Bloating Cuando padece de distensin abdominal, su abdomen se siente lleno, tenso o con dolor. Tambin puede lucir ms grande o hinchado que lo normal (distendido). Las causas comunes de la distensin abdominal incluyen lo siguiente:  Psychologist, sport and exercise.  Estreimiento.  Problemas para digerir los alimentos.  Comer en exceso.  Sndrome de colon irritable. Este cuadro clnico afecta al intestino grueso.  Intolerancia a Water quality scientist. Es la  incapacidad para digerir la lactosa, un azcar natural en productos lcteos.  Enfermedad celaca. Esta es una enfermedad que afecta la capacidad de digerir gluten, una protena presente en algunos  granos.  Gastroparesia. Esta es una afeccin que retrasa el movimiento de alimentos en el estmago e intestino delgado. Es ms frecuente en personas con diabetes mellitus.  Enfermedad de reflujo gastroesofgico (ERGE). Esta es una afeccin digestiva que hace que el cido de su estmago vuelva al esfago.  Retencin urinaria. Esto significa que el cuerpo retiene la [place] y no se puede vaciar la vejiga por completo. Siga estas indicaciones en su casa: Comida y bebida  Evite comer en exceso.  Trate de no tragar aire cuando habla o come.  Evite ingerir alimentos cuando est acostado.  Evite estos alimentos y bebidas: ? Alimentos que pueden causar gases, como el brcoli, repollo, Dance movement psychotherapist y frijoles cocidos. ? Gaseosas. ? Caramelos duros. ? Psychologist, clinical. Medicamentos  [employer] de venta libre y los recetados solamente como se lo haya indicado el [place] medicamentos probiticos. Estos medicamentos contienen bacterias vivas o levadura que pueden ayudar a la digestin.  Tome cpsulas de aceite de menta recubiertas. Actividad  Trate de Optometrist ejercicio a diario. El ejercicio puede ayudarlo a disminuir la distensin que causan los gases y Theatre stage manager estreimiento. Indicaciones generales  Concurra a todas las visitas de control como se lo haya indicado el mdico. Esto es importante. Comunquese con un mdico si:  Tiene nuseas y vmitos.  Tiene diarrea.  Siente dolor abdominal.  Tiene prdida o aumento de peso fuera de lo comn.  Siente un dolor intenso que no puede controlar con los medicamentos. Solicite ayuda inmediatamente si:  Tiene un dolor intenso en el pecho.  Tiene dificultad para respirar.  Le falta el aire.  Tiene problemas para orinar.  La orina es ms oscura que lo normal.  Observa sangre en las heces o tiene heces oscuras de aspecto alquitranado. Resumen  La distensin abdominal significa que el abdomen est hinchado.  Las causas  ms comunes de esta afeccin son tragar [place], el estreimiento y los problemas para digerir alimentos.  Evite comer en exceso y tragar aire.  Evite alimentos que causan gases, bebidas con gas, caramelos duros y goma de mascar. Esta informacin no tiene Marine scientist el consejo del mdico. Asegrese de hacerle al mdico cualquier pregunta que tenga. Document Revised: 11/18/2017 Document Reviewed: 11/18/2017 Elsevier Patient Education  2020 [employer].

## 2019-01-22 DIAGNOSIS — Z3A34 34 weeks gestation of pregnancy: Secondary | ICD-10-CM

## 2019-01-22 DIAGNOSIS — O26893 Other specified pregnancy related conditions, third trimester: Secondary | ICD-10-CM

## 2019-01-22 DIAGNOSIS — R1084 Generalized abdominal pain: Secondary | ICD-10-CM

## 2019-02-19 LAB — OB RESULTS CONSOLE GBS: GBS: NEGATIVE

## 2019-03-03 ENCOUNTER — Other Ambulatory Visit: Payer: Self-pay

## 2019-03-03 ENCOUNTER — Inpatient Hospital Stay (HOSPITAL_COMMUNITY): Payer: Commercial Indemnity | Admitting: Anesthesiology

## 2019-03-03 ENCOUNTER — Encounter (HOSPITAL_COMMUNITY): Payer: Self-pay | Admitting: Family Medicine

## 2019-03-03 ENCOUNTER — Inpatient Hospital Stay (HOSPITAL_COMMUNITY)
Admission: AD | Admit: 2019-03-03 | Discharge: 2019-03-05 | DRG: 807 | Disposition: A | Discharge status: Home / Self Care | Payer: Commercial Indemnity | Attending: Family Medicine | Admitting: Family Medicine

## 2019-03-03 DIAGNOSIS — Z3A39 39 weeks gestation of pregnancy: Secondary | ICD-10-CM

## 2019-03-03 DIAGNOSIS — O26893 Other specified pregnancy related conditions, third trimester: Secondary | ICD-10-CM | POA: Diagnosis present

## 2019-03-03 DIAGNOSIS — Z3490 Encounter for supervision of normal pregnancy, unspecified, unspecified trimester: Secondary | ICD-10-CM

## 2019-03-03 DIAGNOSIS — Z20822 Contact with and (suspected) exposure to covid-19: Secondary | ICD-10-CM | POA: Diagnosis present

## 2019-03-03 LAB — CBC
HCT: 40.3 % (ref 36.0–46.0)
Hemoglobin: 13.8 g/dL (ref 12.0–15.0)
MCH: 31.4 pg (ref 26.0–34.0)
MCHC: 34.2 g/dL (ref 30.0–36.0)
MCV: 91.6 fL (ref 80.0–100.0)
Platelets: 183 10*3/uL (ref 150–400)
RBC: 4.4 MIL/uL (ref 3.87–5.11)
RDW: 13.6 % (ref 11.5–15.5)
WBC: 8.6 10*3/uL (ref 4.0–10.5)
nRBC: 0 % (ref 0.0–0.2)

## 2019-03-03 LAB — TYPE AND SCREEN
ABO/RH(D): A POS
Antibody Screen: NEGATIVE

## 2019-03-03 LAB — ABO/RH: ABO/RH(D): A POS

## 2019-03-03 LAB — RESPIRATORY PANEL BY RT PCR (FLU A&B, COVID)
Influenza A by PCR: NEGATIVE
Influenza B by PCR: NEGATIVE
SARS Coronavirus 2 by RT PCR: NEGATIVE

## 2019-03-03 LAB — RPR: RPR Ser Ql: NONREACTIVE

## 2019-03-03 MED ORDER — TETANUS-DIPHTH-ACELL PERTUSSIS 5-2.5-18.5 LF-MCG/0.5 IM SUSP: 0.5000 mL | Freq: Once | INTRAMUSCULAR | Status: DC

## 2019-03-03 MED ORDER — LACTATED RINGERS IV SOLN: 500.0000 mL | INTRAVENOUS | Status: DC | PRN

## 2019-03-03 MED ORDER — OXYCODONE-ACETAMINOPHEN 5-325 MG PO TABS: 2.0000 | ORAL_TABLET | ORAL | Status: DC | PRN

## 2019-03-03 MED ORDER — WITCH HAZEL-GLYCERIN EX PADS: 1.0000 "application " | MEDICATED_PAD | CUTANEOUS | Status: DC | PRN

## 2019-03-03 MED ORDER — FENTANYL-BUPIVACAINE-NACL 0.5-0.125-0.9 MG/250ML-% EP SOLN
12.0000 mL/h | EPIDURAL | Status: DC | PRN
  Filled 2019-03-03: qty 250

## 2019-03-03 MED ORDER — LACTATED RINGERS IV SOLN: INTRAVENOUS | Status: DC

## 2019-03-03 MED ORDER — SOD CITRATE-CITRIC ACID 500-334 MG/5ML PO SOLN: 30.0000 mL | ORAL | Status: DC | PRN

## 2019-03-03 MED ORDER — OXYTOCIN BOLUS FROM INFUSION
500.0000 mL | Freq: Once | INTRAVENOUS | Status: AC
  Administered 2019-03-03: 500 mL via INTRAVENOUS

## 2019-03-03 MED ORDER — OXYCODONE-ACETAMINOPHEN 5-325 MG PO TABS: 1.0000 | ORAL_TABLET | ORAL | Status: DC | PRN

## 2019-03-03 MED ORDER — OXYTOCIN 40 UNITS IN NORMAL SALINE INFUSION - SIMPLE MED
2.5000 [IU]/h | INTRAVENOUS | Status: DC
  Filled 2019-03-03: qty 1000

## 2019-03-03 MED ORDER — ONDANSETRON HCL 4 MG PO TABS: 4.0000 mg | ORAL_TABLET | ORAL | Status: DC | PRN

## 2019-03-03 MED ORDER — PRENATAL MULTIVITAMIN CH
1.0000 | ORAL_TABLET | Freq: Every day | ORAL | Status: DC
  Administered 2019-03-04 – 2019-03-05 (×2): 1 via ORAL
  Filled 2019-03-03 (×2): qty 1

## 2019-03-03 MED ORDER — PHENYLEPHRINE 40 MCG/ML (10ML) SYRINGE FOR IV PUSH (FOR BLOOD PRESSURE SUPPORT)
80.0000 ug | PREFILLED_SYRINGE | INTRAVENOUS | Status: DC | PRN
  Filled 2019-03-03: qty 10

## 2019-03-03 MED ORDER — ONDANSETRON HCL 4 MG/2ML IJ SOLN: 4.0000 mg | Freq: Four times a day (QID) | INTRAMUSCULAR | Status: DC | PRN

## 2019-03-03 MED ORDER — BENZOCAINE-MENTHOL 20-0.5 % EX AERO
1.0000 "application " | INHALATION_SPRAY | CUTANEOUS | Status: DC | PRN
  Administered 2019-03-03: 1 via TOPICAL
  Filled 2019-03-03: qty 56

## 2019-03-03 MED ORDER — EPHEDRINE 5 MG/ML INJ: 10.0000 mg | INTRAVENOUS | Status: DC | PRN

## 2019-03-03 MED ORDER — ONDANSETRON HCL 4 MG/2ML IJ SOLN: 4.0000 mg | INTRAMUSCULAR | Status: DC | PRN

## 2019-03-03 MED ORDER — DIBUCAINE (PERIANAL) 1 % EX OINT: 1.0000 "application " | TOPICAL_OINTMENT | CUTANEOUS | Status: DC | PRN

## 2019-03-03 MED ORDER — COCONUT OIL OIL
1.0000 "application " | TOPICAL_OIL | Status: DC | PRN
  Administered 2019-03-04: 1 via TOPICAL

## 2019-03-03 MED ORDER — ACETAMINOPHEN 325 MG PO TABS
650.0000 mg | ORAL_TABLET | ORAL | Status: DC | PRN
  Administered 2019-03-04: 650 mg via ORAL
  Filled 2019-03-03: qty 2

## 2019-03-03 MED ORDER — PHENYLEPHRINE 40 MCG/ML (10ML) SYRINGE FOR IV PUSH (FOR BLOOD PRESSURE SUPPORT): 80.0000 ug | PREFILLED_SYRINGE | INTRAVENOUS | Status: DC | PRN

## 2019-03-03 MED ORDER — IBUPROFEN 600 MG PO TABS
600.0000 mg | ORAL_TABLET | Freq: Four times a day (QID) | ORAL | Status: DC
  Administered 2019-03-03 – 2019-03-05 (×8): 600 mg via ORAL
  Filled 2019-03-03 (×8): qty 1

## 2019-03-03 MED ORDER — FLEET ENEMA 7-19 GM/118ML RE ENEM: 1.0000 | ENEMA | RECTAL | Status: DC | PRN

## 2019-03-03 MED ORDER — SIMETHICONE 80 MG PO CHEW: 80.0000 mg | CHEWABLE_TABLET | ORAL | Status: DC | PRN

## 2019-03-03 MED ORDER — LACTATED RINGERS IV SOLN
500.0000 mL | Freq: Once | INTRAVENOUS | Status: AC
  Administered 2019-03-03: 500 mL via INTRAVENOUS

## 2019-03-03 MED ORDER — SODIUM CHLORIDE (PF) 0.9 % IJ SOLN
INTRAMUSCULAR | Status: DC | PRN
  Administered 2019-03-03: 12 mL/h via EPIDURAL

## 2019-03-03 MED ORDER — SENNOSIDES-DOCUSATE SODIUM 8.6-50 MG PO TABS
2.0000 | ORAL_TABLET | ORAL | Status: DC
  Administered 2019-03-03 – 2019-03-04 (×2): 2 via ORAL
  Filled 2019-03-03 (×2): qty 2

## 2019-03-03 MED ORDER — ACETAMINOPHEN 325 MG PO TABS: 650.0000 mg | ORAL_TABLET | ORAL | Status: DC | PRN

## 2019-03-03 MED ORDER — LIDOCAINE HCL (PF) 1 % IJ SOLN
30.0000 mL | INTRAMUSCULAR | Status: AC | PRN
  Administered 2019-03-03 (×2): 6 mL via SUBCUTANEOUS

## 2019-03-03 MED ORDER — DIPHENHYDRAMINE HCL 50 MG/ML IJ SOLN: 12.5000 mg | INTRAMUSCULAR | Status: DC | PRN

## 2019-03-03 MED ORDER — DIPHENHYDRAMINE HCL 25 MG PO CAPS: 25.0000 mg | ORAL_CAPSULE | Freq: Four times a day (QID) | ORAL | Status: DC | PRN

## 2019-03-03 NOTE — Discharge Summary (Signed)
Postpartum Discharge Summary    Patient Name: Jeanne Munoz DOB: [birth year] MRN: [id number]  Date of admission: 03/03/2019 Delivering Provider: Lurline Del   Date of discharge: 03/04/2019  Admitting diagnosis: Pregnancy [Z34.90] Intrauterine pregnancy: [redacted]w[redacted]d    Secondary diagnosis:  Active Problems:   Pregnancy   [redacted] weeks gestation of pregnancy   Labor and delivery indication for care or intervention  Additional problems: None     Discharge diagnosis: Term Pregnancy Delivered                                                                                                Post partum procedures:none  Augmentation: AROM  Complications: None  Hospital course:  Onset of Labor With Vaginal Delivery     34y.o. yo G3P2002 at 35w6das admitted in Active Labor on 03/03/2019. Patient had an uncomplicated labor course as follows: Patient presented to L&D for SOL. Initial SVE: 6/60/-3. Patient received AROM and progressed to complete.  Membrane Rupture Time/Date: 9:16 AM ,03/03/2019   Intrapartum Procedures: Episiotomy: None [1]                                         Lacerations:  1st degree [2]  Patient had a delivery of a Viable infant. 03/03/2019  Information for the patient's newborn:  CaLinley, Moskal0[[id number]]Delivery Method: Vaginal, Spontaneous(Filed from Delivery Summary)     Pateint had an uncomplicated postpartum course. Declines contraception. She is ambulating, tolerating a regular diet, passing flatus, and urinating well. Patient is discharged home in stable condition on 03/04/19.  Delivery time: 1:06 PM    Magnesium Sulfate received: No BMZ received: No Rhophylac:N/A MMR:N/A Transfusion:No  Physical exam  Vitals:   03/03/19 1600 03/03/19 1951 03/03/19 2306 03/04/19 0529  BP: 106/67 (!) 105/51 (!) 94/59 109/64  Pulse: 80 73 72 67  Resp: _0 Temp: 98.7 F (37.1 C) 98.4 F (36.9 C) 97.8 F (36.6 C) 97.6 F (36.4 C)  TempSrc:  Axillary Oral Oral Oral  SpO2: 98% 100%    Weight:      Height:       General: alert, cooperative and no distress Lochia: appropriate Uterine Fundus: firm Incision: N/A DVT Evaluation: No evidence of DVT seen on physical exam. No significant calf/ankle edema. Labs: Lab Results  Component Value Date   WBC 9.7 03/04/2019   HGB 11.1 (L) 03/04/2019   HCT 32.8 (L) 03/04/2019   MCV 92.9 03/04/2019   PLT 156 03/04/2019   CMP Latest Ref Rng & Units 04/14/2018  Glucose 70 - 99 mg/dL 85  BUN 6 - 20 mg/dL 10  Creatinine 0.44 - 1.00 mg/dL 0.68  Sodium 135 - 145 mmol/L 140  Potassium 3.5 - 5.1 mmol/L 3.7  Chloride 98 - 111 mmol/L 108  CO2 22 - 32 mmol/L 23  Calcium 8.9 - 10.3 mg/dL 8.9  Total Protein 6.5 - 8.1 g/dL -  Total Bilirubin 0.3 - 1.2 mg/dL -  Alkaline Phos  38 - 126 U/L -  AST 15 - 41 U/L -  ALT 14 - 54 U/L -   Edinburgh Score: Edinburgh Postnatal Depression Scale Screening Tool 03/03/2019  I have been able to laugh and see the funny side of things. (No Data)    Discharge instruction: per After Visit Summary and "Baby and Me Booklet".  After visit meds:  Allergies as of 03/04/2019   No Known Allergies     Medication List    TAKE these medications   acetaminophen 325 MG tablet Commonly known as: Tylenol Take 2 tablets (650 mg total) by mouth every 4 (four) hours as needed (for pain scale < 4).   hydrOXYzine 25 MG tablet Commonly known as: ATARAX/VISTARIL Take 1 tablet (25 mg total) by mouth every 8 (eight) hours as needed for anxiety.   ibuprofen 600 MG tablet Commonly known as: ADVIL Take 1 tablet (600 mg total) by mouth every 6 (six) hours.   pantoprazole 20 MG tablet Commonly known as: PROTONIX Take 1 tablet (20 mg total) by mouth daily as needed for indigestion.   polyethylene glycol powder 17 GM/SCOOP powder Commonly known as: GLYCOLAX/MIRALAX Take 255 g by mouth once for 1 dose.   prenatal vitamin w/FE, FA 27-1 MG Tabs tablet Take 1 tablet by mouth  daily at 12 noon.       Diet: routine diet  Activity: Advance as tolerated. Pelvic rest for 6 weeks.   Outpatient follow up:4 weeks Follow up Appt:No future appointments. Follow up Visit:  Patient to f/u at HD.     Newborn Data: Live born female  Birth Weight: 3464 grams  APGAR: 31, 9  Newborn Delivery   Birth date/time: 03/03/2019 13:06:00 Delivery type: Vaginal, Spontaneous      Baby Feeding: Breast Disposition:home with mother   03/04/2019 Clarnce Flock, MD

## 2019-03-03 NOTE — H&P (Addendum)
LABOR AND DELIVERY ADMISSION HISTORY AND PHYSICAL NOTE  Jeanne Munoz is a 34 y.o. female G3P2002 with IUP at [redacted]w[redacted]d by LMP presenting for SOL.  She reports positive fetal movement. She denies leakage of fluid or vaginal bleeding.  Prenatal History/Complications: PNC at HD  Pregnancy complications:  - later onset of prenatal care 19 weeks  Past Medical History: Past Medical History:  Diagnosis Date  . Medical history non-contributory     Past Surgical History: Past Surgical History:  Procedure Laterality Date  . NO PAST SURGERIES      Obstetrical History: OB History    Gravida  3   Para  2   Term  2   Preterm  0   AB  0   Living  2     SAB  0   TAB  0   Ectopic  0   Multiple  0   Live Births  2           Social History: Social History   Socioeconomic History  . Marital status: Married    Spouse name: Not on file  . Number of children: Not on file  . Years of education: Not on file  . Highest education level: Not on file  Occupational History  . Not on file  Tobacco Use  . Smoking status: Never Smoker  . Smokeless tobacco: Never Used  Substance and Sexual Activity  . Alcohol use: No  . Drug use: No  . Sexual activity: Yes    Birth control/protection: Condom  Other Topics Concern  . Not on file  Social History Narrative  . Not on file   Social Determinants of Health   Financial Resource Strain:   . Difficulty of Paying Living Expenses: Not on file  Food Insecurity:   . Worried About Charity fundraiser in the Last Year: Not on file  . Ran Out of Food in the Last Year: Not on file  Transportation Needs:   . Lack of Transportation (Medical): Not on file  . Lack of Transportation (Non-Medical): Not on file  Physical Activity:   . Days of Exercise per Week: Not on file  . Minutes of Exercise per Session: Not on file  Stress:   . Feeling of Stress : Not on file  Social Connections:   . Frequency of Communication with Friends  and Family: Not on file  . Frequency of Social Gatherings with Friends and Family: Not on file  . Attends Religious Services: Not on file  . Active Member of Clubs or Organizations: Not on file  . Attends Archivist Meetings: Not on file  . Marital Status: Not on file    Family History: Family History  Problem Relation Age of Onset  . Diabetes Father     Allergies: No Known Allergies  Medications Prior to Admission  Medication Sig Dispense Refill Last Dose  . hydrOXYzine (ATARAX/VISTARIL) 25 MG tablet Take 1 tablet (25 mg total) by mouth every 8 (eight) hours as needed for anxiety. 20 tablet 0   . pantoprazole (PROTONIX) 20 MG tablet Take 1 tablet (20 mg total) by mouth daily as needed for indigestion. 30 tablet 0   . prenatal vitamin w/FE, FA (PRENATAL 1 + 1) 27-1 MG TABS tablet Take 1 tablet by mouth daily at 12 noon.        Review of Systems  All systems reviewed and negative except as stated in HPI  Physical Exam Blood pressure 122/69, pulse  75, temperature 98.1 F (36.7 C), resp. rate 17, weight 87.7 kg, last menstrual period 05/02/2018. General appearance: alert, oriented, NAD Lungs: normal respiratory effort Heart: regular rate Abdomen: soft, non-tender; gravid, FH appropriate for GA Extremities: No calf swelling or tenderness Presentation: cephalic Fetal monitoring: Category 1 (baseline HR 120, moderate variability 6-25bpm, 15x15 accels, no decels)  Uterine activity: Contractions 2-3 min Dilation: 6 Effacement (%): 60 Station: Ballotable Exam by:: BENJI. RN  Prenatal labs: ABO, Rh:  A+ (09/21/2018) Antibody:  Negative (09/21/2018) Rubella:  Immune (09/21/2018) RPR:   Non-reactive (12/14/2018) HBsAg:  Non-reactive (09/21/2018) HIV:   Non-reactive (09/21/2018) GC/Chlamydia: NEGATIVE/NEGATIVE (02/04/2019) GBS:   Negative (02/04/2019) 1-hr GTT: 130, 103 (9/14, 12/7) Genetic screening:  CF Declined d/t Cost, Quad screen Negative Anatomy US: Normal,  female  Prenatal Transfer Tool  Maternal Diabetes: No Genetic Screening: Normal Maternal Ultrasounds/Referrals: Normal Fetal Ultrasounds or other Referrals:  None Maternal Substance Abuse:  No Significant Maternal Medications:  None Significant Maternal Lab Results: Group B Strep negative  No results found for this or any previous visit (from the past 24 hour(s)).  Patient Active Problem List   Diagnosis Date Noted  . Pregnancy 03/03/2019  . Insomnia 05/13/2018  . Dyspepsia 07/27/2015  . Chronic pelvic pain in female 07/05/2015    Assessment: Jeanne Munoz is a 34 y.o. G3P2002 at [redacted]w[redacted]d here for SOL.  #Labor: Active stage, patient progressing well on her own, AROM PRN. Reports shoulder dystocia with second delivery, baby was 9lb9oz. This infant feels smaller, closer to 7 lbs.  #Pain: epidural #FWB: Cat 1 (BL HR 120 bpm, variability 6-25 bpm, 15x15 accels, no decels) #ID:  GBS negative #MOF: Breast #MOC:None #Circ:  No  Jeanne Munoz 03/03/2019, 2:12 AM    OB FELLOW ATTESTATION  I have seen and examined this patient and edited the above documentation in the resident's note to reflect any changes or updates.  Zack Seal, MD/[facility] [place] Fellow  03/03/2019

## 2019-03-03 NOTE — Anesthesia Preprocedure Evaluation (Signed)
Anesthesia Evaluation  Patient identified by MRN, date of birth, ID band Patient awake    Reviewed: Allergy & Precautions, H&P , NPO status , Patient's Chart, lab work & pertinent test results  Airway Mallampati: I  TM Distance: >3 FB Neck ROM: full    Dental no notable dental hx. (+) Teeth Intact   Pulmonary neg pulmonary ROS,    Pulmonary exam normal breath sounds clear to auscultation       Cardiovascular negative cardio ROS Normal cardiovascular exam Rhythm:regular Rate:Normal     Neuro/Psych negative neurological ROS  negative psych ROS   GI/Hepatic negative GI ROS, Neg liver ROS,   Endo/Other  negative endocrine ROS  Renal/GU negative Renal ROS  negative genitourinary   Musculoskeletal negative musculoskeletal ROS (+)   Abdominal (+) + obese,   Peds  Hematology negative hematology ROS (+)   Anesthesia Other Findings   Reproductive/Obstetrics (+) Pregnancy                             Anesthesia Physical Anesthesia Plan  ASA: II  Anesthesia Plan: Epidural   Post-op Pain Management:    Induction:   PONV Risk Score and Plan:   Airway Management Planned:   Additional Equipment:   Intra-op Plan:   Post-operative Plan:   Informed Consent: I have reviewed the patients History and Physical, chart, labs and discussed the procedure including the risks, benefits and alternatives for the proposed anesthesia with the patient or authorized representative who has indicated his/her understanding and acceptance.       Plan Discussed with:   Anesthesia Plan Comments:         Anesthesia Quick Evaluation  

## 2019-03-03 NOTE — Anesthesia Procedure Notes (Signed)
Epidural Patient location during procedure: OB Start time: 03/03/2019 4:05 AM End time: 03/03/2019 4:07 AM  Staffing Anesthesiologist: Leilani Able, MD Performed: anesthesiologist   Preanesthetic Checklist Completed: patient identified, IV checked, site marked, risks and benefits discussed, surgical consent, monitors and equipment checked, pre-op evaluation and timeout performed  Epidural Patient position: sitting Prep: DuraPrep and site prepped and draped Patient monitoring: continuous pulse ox and blood pressure Approach: midline Location: L3-L4 Injection technique: LOR air  Needle:  Needle type: Tuohy  Needle gauge: 17 G Needle length: 9 cm and 9 Needle insertion depth: 5 cm cm Catheter type: closed end flexible Catheter size: 19 Gauge Catheter at skin depth: 10 cm Test dose: negative and Other  Assessment Events: blood not aspirated, injection not painful, no injection resistance, no paresthesia and negative IV test  Additional Notes Reason for block:procedure for pain

## 2019-03-03 NOTE — Progress Notes (Signed)
Labor Progress Note Jeanne Munoz is a 34 y.o. G3P2002 at [redacted]w[redacted]d presented for SOL. S: Comfortable with epidural. Not feeling pressure.  O:  BP (!) 82/43   Pulse 77   Temp 97.6 F (36.4 C) (Oral)   Resp 16   Ht 5\' 4"  (1.626 m)   Wt 87.5 kg   LMP 05/02/2018   BMI 33.13 kg/m  EFM: 130, moderate variability, pos accels, no decels, reactive TOCO: q77m  CVE: Dilation: 6.5 Effacement (%): 70 Cervical Position: Posterior Station: -3 Presentation: Vertex Exam by:: [email or web address], RN   A&P: 34 y.o. 20 [redacted]w[redacted]d here for SOL. #Labor: Progressing well. AROM at this exam with clear, bloody fluid. Anticipate SVD. #Pain: epidural #FWB: Cat I #GBS negative  [redacted]w[redacted]d, MD 9:18 AM

## 2019-03-03 NOTE — MAU Note (Signed)
CTX every 4-5 mins.  Denies LOF.  Some bloody show.  + FM.  GBS -

## 2019-03-04 LAB — CBC
HCT: 32.8 % — ABNORMAL LOW (ref 36.0–46.0)
Hemoglobin: 11.1 g/dL — ABNORMAL LOW (ref 12.0–15.0)
MCH: 31.4 pg (ref 26.0–34.0)
MCHC: 33.8 g/dL (ref 30.0–36.0)
MCV: 92.9 fL (ref 80.0–100.0)
Platelets: 156 10*3/uL (ref 150–400)
RBC: 3.53 MIL/uL — ABNORMAL LOW (ref 3.87–5.11)
RDW: 14.1 % (ref 11.5–15.5)
WBC: 9.7 10*3/uL (ref 4.0–10.5)
nRBC: 0 % (ref 0.0–0.2)

## 2019-03-04 MED ORDER — POLYETHYLENE GLYCOL 3350 17 GM/SCOOP PO POWD: 1.0000 | Freq: Once | ORAL | 0 refills | Status: AC

## 2019-03-04 MED ORDER — IBUPROFEN 600 MG PO TABS: 600.0000 mg | ORAL_TABLET | Freq: Four times a day (QID) | ORAL | 0 refills | Status: DC

## 2019-03-04 MED ORDER — ACETAMINOPHEN 325 MG PO TABS: 650.0000 mg | ORAL_TABLET | ORAL | 0 refills | Status: AC | PRN

## 2019-03-04 NOTE — Discharge Instructions (Signed)
Parto vaginal, cuidados de puerperio Postpartum Care After Vaginal Delivery Lea esta informacin sobre cmo cuidarse desde el momento en que nazca su beb y hasta 6 a 12 semanas despus del parto (perodo del posparto). El mdico tambin podr darle instrucciones ms especficas. Comunquese con su mdico si tiene problemas o preguntas. Siga estas indicaciones en su casa: Hemorragia vaginal  Es normal tener un poco de hemorragia vaginal (loquios) despus del parto. Use un apsito sanitario para el sangrado vaginal y secrecin. ? Durante la primera semana despus del parto, la cantidad y el aspecto de los loquios a menudo es similar a las del perodo menstrual. ? Durante las siguientes semanas disminuir gradualmente hasta convertirse en una secrecin seca amarronada o amarillenta. ? En la mayora de las mujeres, los loquios se detienen completamente entre 4 a 6semanas despus del parto. Los sangrados vaginales pueden variar de mujer a mujer.  Cambie los apsitos sanitarios con frecuencia. Observe si hay cambios en el flujo, como: ? Un aumento repentino en el volumen. ? Cambio en el color. ? Cogulos sanguneos grandes.  Si expulsa un cogulo de sangre por la vagina, gurdelo y llame al mdico para informrselo. No deseche los cogulos de sangre por el inodoro antes de hablar con su mdico.  No use tampones ni se haga duchas vaginales hasta que el mdico la autorice.  Si no est amamantando, volver a tener su perodo entre 6 y 8 semanas despus del parto. Si solamente alimenta al beb con leche materna (lactancia materna exclusiva), podra no volver a tener su perodo hasta que deje de amamantar. Cuidados perineales  Mantenga la zona entre la vagina y el ano (perineo) limpia y seca, como se lo haya indicado el mdico. Utilice apsitos o aerosoles analgsicos y cremas, como se lo hayan indicado.  Si le hicieron un corte en el perineo (episiotoma) o tuvo un desgarro en la vagina, controle la  zona para detectar signos de infeccin hasta que sane. Est atenta a los siguientes signos: ? Aumento del enrojecimiento, la hinchazn o el dolor. ? Presenta lquido o sangre que supura del corte o desgarro. ? Calor. ? Pus o mal olor.  Es posible que le den una botella rociadora para que use en lugar de limpiarse el rea con papel higinico despus de usar el bao. Cuando comience a sanar, podr usar la botella rociadora antes de secarse. Asegrese de secarse suavemente.  Para aliviar el dolor causado por una episiotoma, un desgarro en la vagina o venas hinchadas en el ano (hemorroides), trate de tomar un bao de asiento tibio 2 o 3 veces por da. Un bao de asiento es un bao de agua tibia que se toma mientras se est sentado. El agua solo debe llegar hasta las caderas y cubrir las nalgas. Cuidado de las mamas  En los primeros das despus del parto, las mamas pueden sentirse pesadas, llenas e incmodas (congestin mamaria). Tambin puede escaparse leche de sus senos. El mdico puede sugerirle mtodos para aliviar este malestar. La congestin mamaria debera desaparecer al cabo de unos das.  Si est amamantando: ? Use un sostn que sujete y ajuste bien sus pechos. ? Mantenga los pezones secos y limpios. Aplquese cremas y ungentos, como se lo haya indicado el mdico. ? Es posible que deba usar discos de algodn en el sostn para absorber la leche que se filtre de sus senos. ? Puede tener contracciones uterinas cada vez que amamante durante varias semanas despus del parto. Las contracciones uterinas ayudan al tero a   regresar a su tamao habitual. ? Si tiene algn problema con la lactancia materna, colabore con el mdico o un asesor en lactancia.  Si no est amamantando: ? Evite tocarse mucho las mamas. Al hacerlo, podran producir ms leche. ? Use un sostn que le proporcione el ajuste correcto y compresas fras para reducir la hinchazn. ? No extraiga (saque) leche materna. Esto har que  produzca ms leche. Intimidad y sexualidad  Pregntele al mdico cundo puede retomar la actividad sexual. Esto puede depender de lo siguiente: ? Su riesgo de sufrir infecciones. ? La rapidez con la que est sanando. ? Su comodidad y deseo de retomar la actividad sexual.  Despus del parto, puede quedar embarazada incluso si no ha tenido todava su perodo. Si lo desea, hable con el mdico acerca de los mtodos de control de la natalidad (mtodos anticonceptivos). Medicamentos  Tome los medicamentos de venta libre y los recetados solamente como se lo haya indicado el mdico.  Si le recetaron un antibitico, tmelo como se lo haya indicado el mdico. No deje de tomar el antibitico aunque comience a sentirse mejor. Actividad  Retome sus actividades normales de a poco como se lo haya indicado el mdico. Pregntele al mdico qu actividades son seguras para usted.  Descanse todo lo que pueda. Trate de descansar o tomar una siesta mientras el beb duerme. Comida y bebida   Beba suficiente lquido como para mantener la orina de color amarillo plido.  Coma alimentos ricos en fibras todos los das. Estos pueden ayudarla a prevenir o aliviar el estreimiento. Los alimentos ricos en fibras incluyen, entre otros: ? Panes y cereales integrales. ? Arroz integral. ? Frijoles. ? Frutas y verduras frescas.  No intente perder de peso rpidamente reduciendo el consumo de caloras.  Tome sus vitaminas prenatales hasta la visita de seguimiento de posparto o hasta que su mdico le indique que puede dejar de tomarlas. Estilo de vida  No consuma ningn producto que contenga nicotina o tabaco, como cigarrillos y cigarrillos electrnicos. Si necesita ayuda para dejar de fumar, consulte al mdico.  No beba alcohol, especialmente si est amamantando. Instrucciones generales  Concurra a todas las visitas de seguimiento para usted y el beb, como se lo haya indicado el mdico. La mayora de las mujeres  visita al mdico para un seguimiento de posparto dentro de las primeras 3 a 6 semanas despus del parto. Comunquese con un mdico si:  Se siente incapaz de controlar los cambios que implica tener un hijo y esos sentimientos no desaparecen.  Siente tristeza o preocupacin de forma inusual.  Las mamas se ponen rojas, le duelen o se endurecen.  Tiene fiebre.  Tiene dificultad para retener la orina o para impedir que la orina se escape.  Tiene poco inters o falta de inters en actividades que solan gustarle.  No ha amamantado nada y no ha tenido un perodo menstrual durante 12 semanas despus del parto.  Dej de amamantar al beb y no ha tenido su perodo menstrual durante 12 semanas despus de dejar de amamantar.  Tiene preguntas sobre su cuidado y el del beb.  Elimina un cogulo de sangre grande por la vagina. Solicite ayuda de inmediato si:  Siente dolor en el pecho.  Tiene dificultad para respirar.  Tiene un dolor repentino e intenso en la pierna.  Tiene dolor intenso o clicos en el la parte inferior del abdomen.  Tiene una hemorragia tan intensa de la vagina que empapa ms de un apsito en una hora. El sangrado   no debe ser ms abundante que el perodo ms intenso que haya tenido.  Dolor de cabeza intenso.  Se desmaya.  Tiene visin borrosa o manchas en la vista.  Tiene secrecin vaginal con mal olor.  Tiene pensamientos acerca de lastimarse a usted misma o a su beb. Si alguna vez siente que puede lastimarse a usted misma o a otras personas, o tiene pensamientos de poner fin a su vida, busque ayuda de inmediato. Puede dirigirse al departamento de emergencias ms cercano o llamar a:  El servicio de emergencias de su localidad (911 en [place]  Una lnea de asistencia al suicida y atencin en crisis, como la Lnea Nacional de Prevencin del Suicidio (National Suicide Prevention Lifeline), al 1-800-273-8255. Est disponible las 24 horas del da. Resumen  El  perodo de tiempo justo despus el parto y hasta 6 a 12 semanas despus del parto se denomina perodo posparto.  Retome sus actividades normales de a poco como se lo haya indicado el mdico.  Concurra a todas las visitas de seguimiento para usted y el beb, como se lo haya indicado el mdico. Esta informacin no tiene como fin reemplazar el consejo del mdico. Asegrese de hacerle al mdico cualquier pregunta que tenga. Document Revised: 04/05/2017 Document Reviewed: 12/15/2016 Elsevier Patient Education  2020 Elsevier Inc.  

## 2019-03-04 NOTE — Lactation Note (Signed)
This note was copied from a baby's chart. Lactation Consultation Note Baby 11 hrs old. Mom stated BF going good. Having no problems.  Mom spoke good English didn't need an interpreter. Mom BF baby when LC entered rm. Mom denies painful latching. Mom BF her 1st child 3 yrs, her 2nd child 1 yr.  LC gave mom hand pump as she requested. Flange size fits well everted nipples. Mom denied painful latch. Stated she was doing good and didn't need any assistance at this time. Encouraged to call for assistance or questions. Lactation brochure given.  Patient Name: Jeanne Munoz [id number] Date: 03/04/2019 Reason for consult: Initial assessment;Term   Maternal Data Has patient been taught Hand Expression?: Yes Does the patient have breastfeeding experience prior to this delivery?: Yes  Feeding Feeding Type: Breast Fed  LATCH Score Latch: Grasps breast easily, tongue down, lips flanged, rhythmical sucking.  Audible Swallowing: A few with stimulation  Type of Nipple: Everted at rest and after stimulation  Comfort (Breast/Nipple): Soft / non-tender  Hold (Positioning): No assistance needed to correctly position infant at breast.  LATCH Score: 9  Interventions Interventions: Breast feeding basics reviewed;Breast massage;Breast compression;Hand pump  Lactation Tools Discussed/Used Tools: Pump Breast pump type: Manual WIC Program: No Pump Review: Setup, frequency, and cleaning;Milk Storage Initiated by:: Peri Jefferson RN IBCLC Date initiated:: 03/04/19   Consult Status Consult Status: Follow-up Date: 03/05/19 Follow-up type: In-patient    Charyl Dancer 03/04/2019, 12:24 AM

## 2019-03-04 NOTE — Anesthesia Postprocedure Evaluation (Signed)
Anesthesia Post Note  Patient: Jeanne Munoz  Procedure(s) Performed: AN AD HOC LABOR EPIDURAL     Patient location during evaluation: Mother Baby Anesthesia Type: Epidural Level of consciousness: awake Pain management: satisfactory to patient Vital Signs Assessment: post-procedure vital signs reviewed and stable Respiratory status: spontaneous breathing Cardiovascular status: stable Anesthetic complications: no    Last Vitals:  Vitals:   03/03/19 2306 03/04/19 0529  BP: (!) 94/59 109/64  Pulse: 72 67  Resp: 16 16  Temp: 36.6 C 36.4 C  SpO2:      Last Pain:  Vitals:   03/04/19 0531  TempSrc:   PainSc: 0-No pain   Pain Goal: Patients Stated Pain Goal: 0 (03/03/19 5271)                 Cephus Shelling

## 2019-03-05 NOTE — Lactation Note (Signed)
This note was copied from a baby's chart. Lactation Consultation Note  Patient Name: Jeanne Munoz [id number]'[id number] Date: 03/05/2019   Infant is 33 hrs old & has had 9 voids & 7 stools since birth. Most recent LATCH score was a 10. Shanda Bumps, RN said that baby "was breastfeeding great." Mom is a P3 and nursed her previous children 1-2 years.   This LC deems no need to see prior to d/c unless RN requests otherwise. Lurline Hare [facility] 03/05/2019, 11:13 AM

## 2019-03-05 NOTE — Discharge Summary (Signed)
Postpartum Discharge Summary    Patient Name: Jeanne Munoz DOB: 1985-03-18 MRN: [id number]  Date of admission: 03/03/2019 Delivering Provider: Lurline Del   Date of discharge: 03/05/2019  Admitting diagnosis: Pregnancy [Z34.90] Intrauterine pregnancy: [redacted]w[redacted]d    Secondary diagnosis:  Active Problems:   Pregnancy   [redacted] weeks gestation of pregnancy   Labor and delivery indication for care or intervention  Additional problems: None     Discharge diagnosis: Term Pregnancy Delivered                                                                                                Post partum procedures:none  Augmentation: AROM  Complications: None  Hospital course:  Onset of Labor With Vaginal Delivery     34y.o. yo G3P2002 at 362w6das admitted in Active Labor on 03/03/2019. Patient had an uncomplicated labor course as follows: Patient presented to L&D for SOL. Initial SVE: 6/60/-3. Patient received AROM and progressed to complete.  Membrane Rupture Time/Date: 9:16 AM ,03/03/2019   Intrapartum Procedures: Episiotomy: None [1]                                         Lacerations:  1st degree [2]  Patient had a delivery of a Viable infant. 03/03/2019  Information for the patient's newborn:  CaGlynis, Hunsucker0[[id number]]Delivery Method: Vaginal, Spontaneous(Filed from Delivery Summary)     Pateint had an uncomplicated postpartum course. Declines contraception. She is ambulating, tolerating a regular diet, passing flatus, and urinating well. Patient is discharged home in stable condition on 03/05/19.  Delivery time: 1:06 PM    Magnesium Sulfate received: No BMZ received: No Rhophylac:N/A MMR:N/A Transfusion:No  Physical exam  Vitals:   03/04/19 0529 03/04/19 1535 03/04/19 2200 03/05/19 0522  BP: 109/64 (!) 103/59 (!) 99/54 (!) 89/51  Pulse: 67 83 73 67  Resp: '16 16 16 16  ' Temp: 97.6 F (36.4 C) 98.2 F (36.8 C) 98.4 F (36.9 C) 98.2 F (36.8 C)  TempSrc:  Oral Oral Oral Oral  SpO2:  99%    Weight:      Height:       General: alert, cooperative and no distress Lochia: appropriate Uterine Fundus: firm Incision: N/A DVT Evaluation: No evidence of DVT seen on physical exam. No significant calf/ankle edema. Labs: Lab Results  Component Value Date   WBC 9.7 03/04/2019   HGB 11.1 (L) 03/04/2019   HCT 32.8 (L) 03/04/2019   MCV 92.9 03/04/2019   PLT 156 03/04/2019   CMP Latest Ref Rng & Units 04/14/2018  Glucose 70 - 99 mg/dL 85  BUN 6 - 20 mg/dL 10  Creatinine 0.44 - 1.00 mg/dL 0.68  Sodium 135 - 145 mmol/L 140  Potassium 3.5 - 5.1 mmol/L 3.7  Chloride 98 - 111 mmol/L 108  CO2 22 - 32 mmol/L 23  Calcium 8.9 - 10.3 mg/dL 8.9  Total Protein 6.5 - 8.1 g/dL -  Total Bilirubin 0.3 - 1.2 mg/dL -  Alkaline  Phos 38 - 126 U/L -  AST 15 - 41 U/L -  ALT 14 - 54 U/L -   Edinburgh Score: Edinburgh Postnatal Depression Scale Screening Tool 03/04/2019  I have been able to laugh and see the funny side of things. 0  I have looked forward with enjoyment to things. 0  I have blamed myself unnecessarily when things went wrong. 0  I have been anxious or worried for no good reason. 0  I have felt scared or panicky for no good reason. 0  Things have been getting on top of me. 1  I have been so unhappy that I have had difficulty sleeping. 0  I have felt sad or miserable. 0  I have been so unhappy that I have been crying. 0  The thought of harming myself has occurred to me. 0  Edinburgh Postnatal Depression Scale Total 1    Discharge instruction: per After Visit Summary and "Baby and Me Booklet".  After visit meds:  Allergies as of 03/05/2019   No Known Allergies     Medication List    TAKE these medications   acetaminophen 325 MG tablet Commonly known as: Tylenol Take 2 tablets (650 mg total) by mouth every 4 (four) hours as needed (for pain scale < 4).   hydrOXYzine 25 MG tablet Commonly known as: ATARAX/VISTARIL Take 1 tablet (25 mg  total) by mouth every 8 (eight) hours as needed for anxiety.   ibuprofen 600 MG tablet Commonly known as: ADVIL Take 1 tablet (600 mg total) by mouth every 6 (six) hours.   pantoprazole 20 MG tablet Commonly known as: PROTONIX Take 1 tablet (20 mg total) by mouth daily as needed for indigestion.   prenatal vitamin w/FE, FA 27-1 MG Tabs tablet Take 1 tablet by mouth daily at 12 noon.     ASK your doctor about these medications   polyethylene glycol powder 17 GM/SCOOP powder Commonly known as: GLYCOLAX/MIRALAX Take 255 g by mouth once for 1 dose. Ask about: Should I take this medication?       Diet: routine diet  Activity: Advance as tolerated. Pelvic rest for 6 weeks.   Outpatient follow up:4 weeks Follow up Appt:No future appointments. Follow up Visit:  Patient to f/u at HD.   Newborn Data: Live born female  Birth Weight: 3464 grams  APGAR: 71, 9  Newborn Delivery   Birth date/time: 03/03/2019 13:06:00 Delivery type: Vaginal, Spontaneous      Baby Feeding: Breast Disposition:home with mother   03/05/2019 Chauncey Mann, MD

## 2019-06-08 IMAGING — DX PORTABLE CHEST - 1 VIEW
1 series · 1 of 1 positions shown · non-contrast
Comparison: None.

CLINICAL DATA: Shortness of breath

EXAM:
PORTABLE CHEST 1 VIEW

[chest ap]
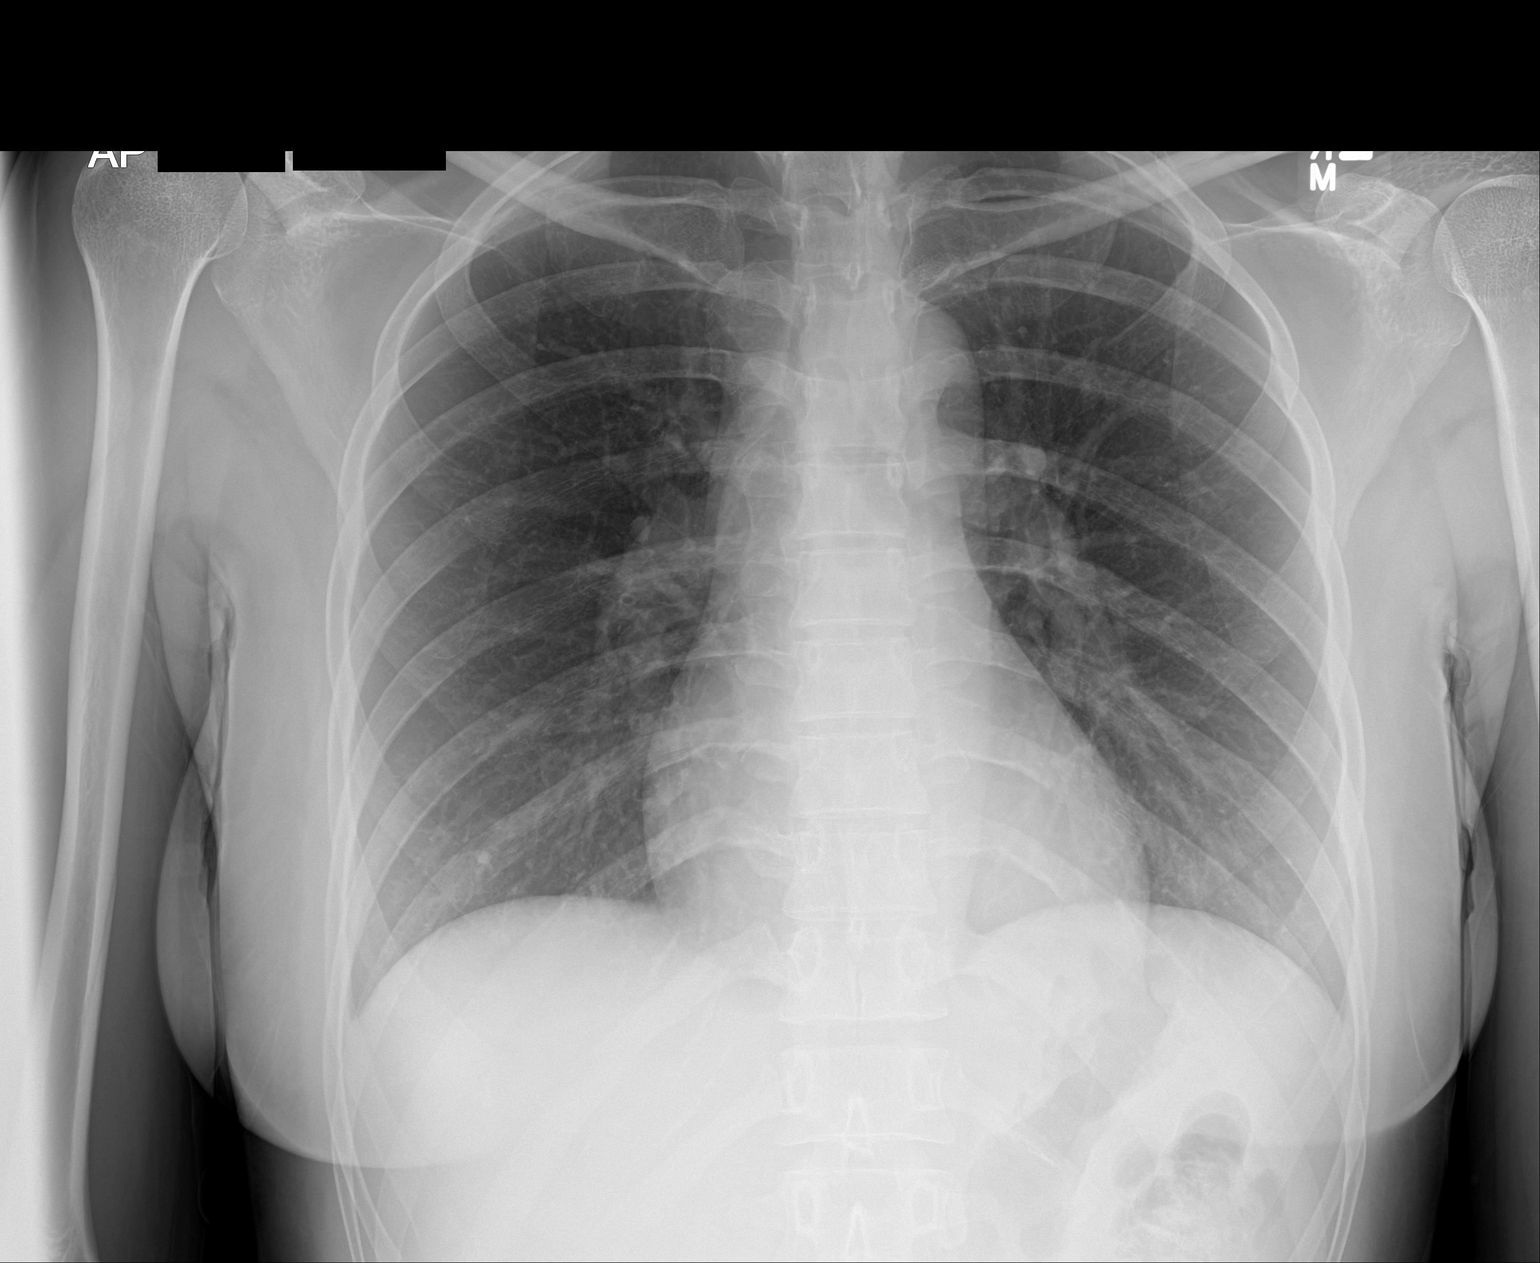

[1 of 1 positions shown; findings below may reference images not displayed]

FINDINGS: Lungs are clear. Heart size and pulmonary vascularity are normal. No
adenopathy. No bone lesions.
IMPRESSION: No abnormality noted.

## 2022-04-17 ENCOUNTER — Encounter (HOSPITAL_COMMUNITY): Payer: Self-pay

## 2022-04-17 ENCOUNTER — Ambulatory Visit (HOSPITAL_COMMUNITY)
Admission: EM | Admit: 2022-04-17 | Discharge: 2022-04-17 | Disposition: A | Discharge status: Home / Self Care | Payer: Self-pay | Attending: Sports Medicine | Admitting: Sports Medicine

## 2022-04-17 DIAGNOSIS — L03031 Cellulitis of right toe: Secondary | ICD-10-CM

## 2022-04-17 MED ORDER — DOXYCYCLINE HYCLATE 100 MG PO CAPS: 100.0000 mg | ORAL_CAPSULE | Freq: Two times a day (BID) | ORAL | 0 refills | Status: AC

## 2022-04-17 NOTE — ED Provider Notes (Signed)
[facility]   [id number] 04/17/22 Arrival Time: 9675  ASSESSMENT & PLAN:  1. Paronychia of great toe of right foot    -Start doxycycline 100 mg twice daily for 7 days.  Continue warm water soaks 3 times a day and topical antibiotic cream.  Instructions given on proper toenail cutting and care.  All questions answered she agrees to plan.   Meds ordered this encounter  Medications   doxycycline (VIBRAMYCIN) 100 MG capsule    Sig: Take 1 capsule (100 mg total) by mouth 2 (two) times daily for 7 days.    Dispense:  14 capsule    Refill:  0     Discharge Instructions      Soak the toe in warm water for 15-20 mins 3x per day Take antibiotic 2x per day for 7 days Continue topical antibiotic cream daily too  It was nice to meet you today!        Reviewed expectations re: course of current medical issues. Questions answered. Outlined signs and symptoms indicating need for more acute intervention. Patient verbalized understanding. After Visit Summary given.   SUBJECTIVE: Pleasant 37 year old female comes urgent care to be evaluated for right toe pain.  She was cutting her nails on her right foot over the weekend and cut the lateral edge too short.  She developed an ingrown toenail.  She developed redness and pain at the lateral aspect of the great toe.  She has been able to express some yellow pus from it.  She has been applying over-the-counter topical antibiotic cream and wiping it with alcohol and soaking in warm water.  The toe just mostly hurts whenever she puts her shoes on.  Here for further evaluation.  No fevers or nail issues anywhere else on the foot.  Patient's last menstrual period was 03/29/2022. Past Surgical History:  Procedure Laterality Date   NO PAST SURGERIES       OBJECTIVE:  Vitals:   04/17/22 0905  BP: (!) 116/59  Pulse: 68  Resp: 16  Temp: 97.9 F (36.6 C)  TempSrc: Oral  SpO2: 99%     Physical Exam Vitals reviewed.   Constitutional:      General: She is not in acute distress.    Appearance: She is not ill-appearing.  Cardiovascular:     Rate and Rhythm: Normal rate.  Pulmonary:     Effort: Pulmonary effort is normal.  Musculoskeletal:     Comments: R Foot -paronychia of right great toe at lateral nailbed.  No obvious abscess.  Tender to palpation at the lateral aspect of the great toe.  Full range of motion of the toe.  Full sensation.  Neurovascular intact.  Skin:    Capillary Refill: Capillary refill takes less than 2 seconds.  Neurological:     General: No focal deficit present.  Psychiatric:        Mood and Affect: Mood normal.      Labs: Results for orders placed or performed during the hospital encounter of 03/03/19  Respiratory Panel by RT PCR (Flu A&B, Covid) - Nasopharyngeal Swab   Specimen: Nasopharyngeal Swab  Result Value Ref Range   SARS Coronavirus 2 by RT PCR NEGATIVE NEGATIVE   Influenza A by PCR NEGATIVE NEGATIVE   Influenza B by PCR NEGATIVE NEGATIVE  OB RESULT CONSOLE Group B Strep  Result Value Ref Range   GBS Negative   CBC  Result Value Ref Range   WBC 8.6 4.0 - 10.5 K/uL   RBC  4.40 3.87 - 5.11 MIL/uL   Hemoglobin 13.8 12.0 - 15.0 g/dL   HCT 16.8 37.2 - 90.2 %   MCV 91.6 80.0 - 100.0 fL   MCH 31.4 26.0 - 34.0 pg   MCHC 34.2 30.0 - 36.0 g/dL   RDW 11.1 55.2 - 08.0 %   Platelets 183 150 - 400 K/uL   nRBC 0.0 0.0 - 0.2 %  RPR  Result Value Ref Range   RPR Ser Ql NON REACTIVE NON REACTIVE  CBC  Result Value Ref Range   WBC 9.7 4.0 - 10.5 K/uL   RBC 3.53 (L) 3.87 - 5.11 MIL/uL   Hemoglobin 11.1 (L) 12.0 - 15.0 g/dL   HCT 22.3 (L) 36.1 - 22.4 %   MCV 92.9 80.0 - 100.0 fL   MCH 31.4 26.0 - 34.0 pg   MCHC 33.8 30.0 - 36.0 g/dL   RDW 49.7 53.0 - 05.1 %   Platelets 156 150 - 400 K/uL   nRBC 0.0 0.0 - 0.2 %  Type and screen [facility]  Result Value Ref Range   ABO/RH(D) A POS    Antibody Screen NEG    Sample Expiration       03/06/2019,2359 Performed at [facility], [address] [place], [place] [address]   ABO/Rh  Result Value Ref Range   ABO/RH(D)      A POS Performed at [facility], [address], [place], [place] [address]    Labs Reviewed - No data to display  Imaging: No results found.   No Known Allergies                                             Past Medical History:  Diagnosis Date   Medical history non-contributory     Social History   Socioeconomic History   Marital status: Married    Spouse name: Paco   Number of children: Not on file   Years of education: Not on file   Highest education level: Not on file  Occupational History   Not on file  Tobacco Use   Smoking status: Never   Smokeless tobacco: Never  Vaping Use   Vaping Use: Never used  Substance and Sexual Activity   Alcohol use: No   Drug use: No   Sexual activity: Yes    Birth control/protection: Condom  Other Topics Concern   Not on file  Social History Narrative   Not on file   Social Determinants of Health   Financial Resource Strain: Not on file  Food Insecurity: Not on file  Transportation Needs: Not on file  Physical Activity: Not on file  Stress: Not on file  Social Connections: Not on file  Intimate Partner Violence: Not on file    Family History  Problem Relation Age of Onset   Diabetes Father       Arvella Nigh, MD 04/17/22 [phone]

## 2022-04-17 NOTE — ED Triage Notes (Signed)
Patient reports an infection of the right great toe x 2 days. Patient states she had previously cut a piece of nail that was ingrown a few days earlier.  Patient states she has applied alcohol and an antibacterial cream on the toe.

## 2022-04-17 NOTE — Discharge Instructions (Addendum)
Soak the toe in warm water for 15-20 mins 3x per day Take antibiotic 2x per day for 7 days Continue topical antibiotic cream daily too  It was nice to meet you today!

## 2022-10-02 ENCOUNTER — Emergency Department (HOSPITAL_COMMUNITY)
Admission: EM | Admit: 2022-10-02 | Discharge: 2022-10-03 | Disposition: A | Discharge status: Home / Self Care | Payer: Self-pay | Attending: Emergency Medicine | Admitting: Emergency Medicine

## 2022-10-02 ENCOUNTER — Other Ambulatory Visit: Payer: Self-pay

## 2022-10-02 DIAGNOSIS — R110 Nausea: Secondary | ICD-10-CM | POA: Insufficient documentation

## 2022-10-02 DIAGNOSIS — N9489 Other specified conditions associated with female genital organs and menstrual cycle: Secondary | ICD-10-CM | POA: Insufficient documentation

## 2022-10-02 DIAGNOSIS — R1030 Lower abdominal pain, unspecified: Secondary | ICD-10-CM

## 2022-10-02 LAB — CBC
HCT: 36.8 % (ref 36.0–46.0)
Hemoglobin: 12 g/dL (ref 12.0–15.0)
MCH: 28.5 pg (ref 26.0–34.0)
MCHC: 32.6 g/dL (ref 30.0–36.0)
MCV: 87.4 fL (ref 80.0–100.0)
Platelets: 166 10*3/uL (ref 150–400)
RBC: 4.21 MIL/uL (ref 3.87–5.11)
RDW: 13.7 % (ref 11.5–15.5)
WBC: 4.7 10*3/uL (ref 4.0–10.5)
nRBC: 0 % (ref 0.0–0.2)

## 2022-10-02 LAB — URINALYSIS, ROUTINE W REFLEX MICROSCOPIC
Bilirubin Urine: NEGATIVE
Glucose, UA: NEGATIVE mg/dL
Hgb urine dipstick: NEGATIVE
Ketones, ur: NEGATIVE mg/dL
Leukocytes,Ua: NEGATIVE
Nitrite: NEGATIVE
Protein, ur: NEGATIVE mg/dL
Specific Gravity, Urine: 1.011 (ref 1.005–1.030)
pH: 8 (ref 5.0–8.0)

## 2022-10-02 LAB — COMPREHENSIVE METABOLIC PANEL
ALT: 15 U/L (ref 0–44)
AST: 19 U/L (ref 15–41)
Albumin: 3.8 g/dL (ref 3.5–5.0)
Alkaline Phosphatase: 38 U/L (ref 38–126)
Anion gap: 9 (ref 5–15)
BUN: 15 mg/dL (ref 6–20)
CO2: 25 mmol/L (ref 22–32)
Calcium: 8.8 mg/dL — ABNORMAL LOW (ref 8.9–10.3)
Chloride: 102 mmol/L (ref 98–111)
Creatinine, Ser: 0.88 mg/dL (ref 0.44–1.00)
GFR, Estimated: >60 mL/min (ref >60)
Glucose, Bld: 92 mg/dL (ref 70–99)
Potassium: 3.8 mmol/L (ref 3.5–5.1)
Sodium: 136 mmol/L (ref 135–145)
Total Bilirubin: 0.5 mg/dL (ref 0.3–1.2)
Total Protein: 6.9 g/dL (ref 6.5–8.1)

## 2022-10-02 LAB — HCG, SERUM, QUALITATIVE: Preg, Serum: NEGATIVE

## 2022-10-02 LAB — LIPASE, BLOOD: Lipase: 56 U/L — ABNORMAL HIGH (ref 11–51)

## 2022-10-02 NOTE — ED Triage Notes (Signed)
Pt arrives to ED c/o lower abdominal pain that radiates to both flanks x 2 months. Pt endorses nausea with no vomiting. No dysuria but reports abdominal bloating

## 2022-10-03 ENCOUNTER — Emergency Department (HOSPITAL_COMMUNITY): Payer: Self-pay

## 2022-10-03 MED ORDER — KETOROLAC TROMETHAMINE 15 MG/ML IJ SOLN
15.0000 mg | Freq: Once | INTRAMUSCULAR | Status: AC
  Administered 2022-10-03: 15 mg via INTRAVENOUS
  Filled 2022-10-03: qty 1

## 2022-10-03 MED ORDER — IOHEXOL 350 MG/ML SOLN
75.0000 mL | Freq: Once | INTRAVENOUS | Status: AC | PRN
  Administered 2022-10-03: 75 mL via INTRAVENOUS

## 2022-10-03 MED ORDER — MELOXICAM 7.5 MG PO TABS: 7.5000 mg | ORAL_TABLET | Freq: Every day | ORAL | 0 refills | Status: AC

## 2022-10-03 NOTE — Discharge Instructions (Addendum)
Return to the ER for severe or concerning symptoms. Follow up with your primary care provider. If you do not have a primary care provider, please follow up with [facility].   You can make an appointment to see a GYN provider:   Center for [facility] at [facility]  [address]  [phone]   Center for [facility] at [facility]  [address] [employer]  [phone]   Center for [facility] at [facility] [address] [place]  [phone]   Center for [facility] at [facility] [facility]  [address]  [phone]   Center for [employer] [facility]  [place]  [phone]   If you already have an established OB/GYN provider in the area you can make an appointment with them as well.

## 2022-10-03 NOTE — ED Provider Notes (Signed)
[facility] AT [facility] Provider Note   CSN: [id number] Arrival date & time: 10/02/22  1833     History  Chief Complaint  Patient presents with   Abdominal Pain    Jeanne Munoz is a 37 y.o. female.  37 year old female presenys with concern for abdominal pain. Reports generalized abdominal pain that radiates into her back, onset 2 months ago, intermittent at that time, now daily. Described as aching and bloating. Initially thought to be due to eating spicy foods but has decreased intake of these foods and symptoms continue. Had nausea the other day, no vomiting. No changes in bowel habits. Reports urinary frequency without dysuria. Patient is here with her husband, states that her mom died from cancer 6 months ago, unknown primary source, at the time of diagnosis, had diffuse abdominal process with bony mets.  No prior abdominal surgeries.       Home Medications Prior to Admission medications   Medication Sig Start Date End Date Taking? Authorizing Provider  meloxicam (MOBIC) 7.5 MG tablet Take 1 tablet (7.5 mg total) by mouth daily for 10 days. 10/03/22 10/13/22 Yes Jeannie Fend, PA-C  acetaminophen (TYLENOL) 325 MG tablet Take 2 tablets (650 mg total) by mouth every 4 (four) hours as needed (for pain scale < 4). 03/04/19   Venora Maples, MD  hydrOXYzine (ATARAX/VISTARIL) 25 MG tablet Take 1 tablet (25 mg total) by mouth every 8 (eight) hours as needed for anxiety. 04/14/18   Shaune Pollack, MD  ibuprofen (ADVIL) 600 MG tablet Take 1 tablet (600 mg total) by mouth every 6 (six) hours. 03/04/19   Venora Maples, MD  pantoprazole (PROTONIX) 20 MG tablet Take 1 tablet (20 mg total) by mouth daily as needed for indigestion. 01/21/19   Aviva Signs, CNM  prenatal vitamin w/FE, FA (PRENATAL 1 + 1) 27-1 MG TABS tablet Take 1 tablet by mouth daily at 12 noon.    [provider]      Allergies    Patient has no known allergies.     Review of Systems   Review of Systems Negative except as per HPI Physical Exam Updated Vital Signs BP 104/66 (BP Location: Right Arm)   Pulse (!) 56   Temp 98.3 F (36.8 C) (Oral)   Resp 17   Ht 5\' 4"  (1.626 m)   Wt 68.9 kg   SpO2 99%   BMI 26.09 kg/m  Physical Exam Vitals and nursing note reviewed.  Constitutional:      General: She is not in acute distress.    Appearance: She is well-developed. She is not diaphoretic.  HENT:     Head: Normocephalic and atraumatic.  Cardiovascular:     Pulses: Normal pulses.  Pulmonary:     Effort: Pulmonary effort is normal.  Abdominal:     Tenderness: There is abdominal tenderness in the right lower quadrant, suprapubic area and left lower quadrant. There is no right CVA tenderness or left CVA tenderness.  Musculoskeletal:     Right lower leg: No edema.     Left lower leg: No edema.  Skin:    General: Skin is warm and dry.     Findings: No erythema or rash.  Neurological:     Mental Status: She is alert and oriented to person, place, and time.  Psychiatric:        Behavior: Behavior normal.     ED Results / Procedures / Treatments   Labs (  all labs ordered are listed, but only abnormal results are displayed) Labs Reviewed  LIPASE, BLOOD - Abnormal; Notable for the following components:      Result Value   Lipase 56 (*)    All other components within normal limits  COMPREHENSIVE METABOLIC PANEL - Abnormal; Notable for the following components:   Calcium 8.8 (*)    All other components within normal limits  URINALYSIS, ROUTINE W REFLEX MICROSCOPIC - Abnormal; Notable for the following components:   Color, Urine STRAW (*)    All other components within normal limits  CBC  HCG, SERUM, QUALITATIVE    EKG None  Radiology CT ABDOMEN PELVIS W CONTRAST  Result Date: 10/03/2022 CLINICAL DATA:  Lower abdominal pain radiating to both flanks for 2 months. EXAM: CT ABDOMEN AND PELVIS WITH CONTRAST TECHNIQUE: Multidetector CT  imaging of the abdomen and pelvis was performed using the standard protocol following bolus administration of intravenous contrast. RADIATION DOSE REDUCTION: This exam was performed according to the departmental dose-optimization program which includes automated exposure control, adjustment of the mA and/or kV according to patient size and/or use of iterative reconstruction technique. CONTRAST:  75mL OMNIPAQUE IOHEXOL 350 MG/ML SOLN COMPARISON:  None Available. FINDINGS: Lower chest: No acute abnormality. Hepatobiliary: No focal liver abnormality is seen. No gallstones, gallbladder wall thickening, or biliary dilatation. Pancreas: Unremarkable. No pancreatic ductal dilatation or surrounding inflammatory changes. Spleen: Normal in size without focal abnormality. Adrenals/Urinary Tract: The adrenal glands are within normal limits. The kidneys enhance symmetrically. No renal calculus or obstructive uropathy. An extrarenal pelvis is noted on the right. The bladder is unremarkable. Stomach/Bowel: Stomach is within normal limits. Appendix appears normal. No evidence of bowel wall thickening, distention, or inflammatory changes. No free air or pneumatosis. Vascular/Lymphatic: No significant vascular findings are present. No enlarged abdominal or pelvic lymph nodes. Reproductive: Prominent vessels are noted in the uterine region bilaterally, greater on the left than on the right, which may be associated with pelvic congestion syndrome. The uterus is otherwise within normal limits. A small cyst with a hyperdense rim is noted in the right ovary, likely corpus luteal or hemorrhagic cyst. Other: A small amount of free fluid is present in the cul-de-sac which may be physiologic. Fat containing umbilical hernia is noted. Musculoskeletal: No acute osseous abnormality. IMPRESSION: 1. No acute intra-abdominal process. 2. Prominent vasculature in the pelvis adjacent to the uterus, may be related to pelvic congestion syndrome. 3.  Small amount of free fluid in the cul-de-sac, may be physiologic. Electronically Signed   By: Thornell Sartorius M.D.   On: 10/03/2022 01:58    Procedures Procedures    Medications Ordered in ED Medications  iohexol (OMNIPAQUE) 350 MG/ML injection 75 mL (75 mLs Intravenous Contrast Given 10/03/22 0049)  ketorolac (TORADOL) 15 MG/ML injection 15 mg (15 mg Intravenous Given 10/03/22 0222)    ED Course/ Medical Decision Making/ A&P                                 Medical Decision Making Amount and/or Complexity of Data Reviewed Radiology: ordered.  Risk Prescription drug management.   This patient presents to the ED for concern of abdominal pain, this involves an extensive number of treatment options, and is a complaint that carries with it a high risk of complications and morbidity.  The differential diagnosis includes but not limited to PUD, gastritis, cholelithiasis, fibroids, interstitial cystis     Co morbidities that  complicate the patient evaluation  Otherwise healthy   Additional history obtained:  External records from outside source obtained and reviewed including prior labs on file for comparison    Lab Tests:  I Ordered, and personally interpreted labs.  The pertinent results include:  CBC WNL. CMP without significant findings. Lipase minimally elevated. UA without significant findings    Imaging Studies ordered:  I ordered imaging studies including CT abdomen/pelvis   I independently visualized and interpreted imaging which showed, negative for mass, free air I agree with the radiologist interpretation possible pelvic congestion syndrome    Problem List / ED Course / Critical interventions / Medication management  37 year old female with complaint of abdominal pain as above. Found to have diffuse lower abdominal pain, no concerns for STIs, here with spouse. Vitals reviewed and without significant findings, labs without significant or concerning findings. Patient  is concerned due to death of her mother 6 months ago with cancer in the abdomen. CT ordered for persistent pain, possible pelvic congestion syndrome, otherwise, no significant or acute findings. Discussed findings with patent, will give toradol prior to DC with rx for meloxicam and referral to GYN/PCP.  I ordered medication including toradol  for pain prior to discharge  I have reviewed the patients home medicines and have made adjustments as needed   Social Determinants of Health:  Lives with spouse, no PCP on file   Test / Admission - Considered:  Stable for dc with referral to PCP and GYN clinic          Final Clinical Impression(s) / ED Diagnoses Final diagnoses:  Lower abdominal pain  Pelvic congestion syndrome    Rx / DC Orders ED Discharge Orders          Ordered    meloxicam (MOBIC) 7.5 MG tablet  Daily        10/03/22 0215              Jeannie Fend, PA-C 10/03/22 0235    Dione Booze, MD 10/03/22 [phone]

## 2022-11-25 ENCOUNTER — Other Ambulatory Visit (HOSPITAL_COMMUNITY)
Admission: RE | Admit: 2022-11-25 | Discharge: 2022-11-25 | Disposition: A | Discharge status: Home / Self Care | Payer: Self-pay | Source: Ambulatory Visit | Attending: Obstetrics and Gynecology | Admitting: Obstetrics and Gynecology

## 2022-11-25 ENCOUNTER — Encounter: Payer: Self-pay | Admitting: Obstetrics and Gynecology

## 2022-11-25 ENCOUNTER — Ambulatory Visit (INDEPENDENT_AMBULATORY_CARE_PROVIDER_SITE_OTHER): Payer: Self-pay | Admitting: Obstetrics and Gynecology

## 2022-11-25 VITALS — Ht 66.0 in | Wt 154.0 lb

## 2022-11-25 DIAGNOSIS — Z01419 Encounter for gynecological examination (general) (routine) without abnormal findings: Secondary | ICD-10-CM

## 2022-11-25 DIAGNOSIS — Z1339 Encounter for screening examination for other mental health and behavioral disorders: Secondary | ICD-10-CM

## 2022-11-25 DIAGNOSIS — N920 Excessive and frequent menstruation with regular cycle: Secondary | ICD-10-CM

## 2022-11-25 DIAGNOSIS — R102 Pelvic and perineal pain: Secondary | ICD-10-CM

## 2022-11-25 NOTE — Patient Instructions (Signed)
It was nice meeting you today! Please follow up with your primary care doctor to rule out any other causes of your pain.

## 2022-11-25 NOTE — Progress Notes (Unsigned)
 {  new or return:[id number]} GYNECOLOGY VISIT  Subjective:  Jeanne Munoz is a 37 y.o. [phone] with LMP *** presenting for ***   Sees [facility] - had pap with them this year Spotting with exercise > blood with wiping No pain  Last sti test?  Menstrual Hx:  PMH: PSH: Meds: All: OB: Pap Hx:  Soc: FHx:  Objective:   Vitals:   11/25/22 1512  Weight: 154 lb (69.9 kg)  Height: 5\' 6"  (1.676 m)    General:  Alert, oriented and cooperative. Patient is in no acute distress.  Skin: Skin is warm and dry. No rash noted.   Cardiovascular: Normal heart rate noted  Respiratory: Normal respiratory effort, no problems with respiration noted  Abdomen: Soft, non-tender, non-distended   Pelvic: NEFG.   Exam performed in the presence of a chaperone  Assessment and Plan:  Jeanne Munoz is a 37 y.o. with ***  There are no diagnoses linked to this encounter.  No follow-ups on file.  No future appointments.  Lennart Pall, MD

## 2022-11-27 LAB — CYTOLOGY - PAP
Chlamydia: NEGATIVE
Comment: NEGATIVE
Comment: NEGATIVE
Comment: NEGATIVE
Comment: NORMAL
Diagnosis: NEGATIVE
High risk HPV: NEGATIVE
Neisseria Gonorrhea: NEGATIVE
Trichomonas: NEGATIVE

## 2022-11-28 ENCOUNTER — Ambulatory Visit (HOSPITAL_BASED_OUTPATIENT_CLINIC_OR_DEPARTMENT_OTHER)
Admission: RE | Admit: 2022-11-28 | Discharge: 2022-11-28 | Disposition: A | Discharge status: Home / Self Care | Payer: Self-pay | Source: Ambulatory Visit | Attending: Obstetrics and Gynecology | Admitting: Obstetrics and Gynecology

## 2022-11-28 DIAGNOSIS — R102 Pelvic and perineal pain: Secondary | ICD-10-CM | POA: Insufficient documentation

## 2022-11-30 ENCOUNTER — Other Ambulatory Visit: Payer: Self-pay

## 2022-11-30 ENCOUNTER — Encounter (HOSPITAL_COMMUNITY): Payer: Self-pay | Admitting: *Deleted

## 2022-11-30 ENCOUNTER — Emergency Department (HOSPITAL_COMMUNITY): Payer: Self-pay

## 2022-11-30 ENCOUNTER — Emergency Department (HOSPITAL_COMMUNITY)
Admission: EM | Admit: 2022-11-30 | Discharge: 2022-12-01 | Disposition: A | Discharge status: Home / Self Care | Payer: Self-pay | Attending: Emergency Medicine | Admitting: Emergency Medicine

## 2022-11-30 DIAGNOSIS — R079 Chest pain, unspecified: Secondary | ICD-10-CM

## 2022-11-30 DIAGNOSIS — E876 Hypokalemia: Secondary | ICD-10-CM | POA: Insufficient documentation

## 2022-11-30 DIAGNOSIS — R0789 Other chest pain: Secondary | ICD-10-CM | POA: Insufficient documentation

## 2022-11-30 LAB — CBC
HCT: 37.8 % (ref 36.0–46.0)
Hemoglobin: 12.6 g/dL (ref 12.0–15.0)
MCH: 28.4 pg (ref 26.0–34.0)
MCHC: 33.3 g/dL (ref 30.0–36.0)
MCV: 85.1 fL (ref 80.0–100.0)
Platelets: 210 10*3/uL (ref 150–400)
RBC: 4.44 MIL/uL (ref 3.87–5.11)
RDW: 13.2 % (ref 11.5–15.5)
WBC: 5.6 10*3/uL (ref 4.0–10.5)
nRBC: 0 % (ref 0.0–0.2)

## 2022-11-30 LAB — BASIC METABOLIC PANEL
Anion gap: 10 (ref 5–15)
BUN: 18 mg/dL (ref 6–20)
CO2: 22 mmol/L (ref 22–32)
Calcium: 9.2 mg/dL (ref 8.9–10.3)
Chloride: 105 mmol/L (ref 98–111)
Creatinine, Ser: 0.91 mg/dL (ref 0.44–1.00)
GFR, Estimated: >60 mL/min (ref >60)
Glucose, Bld: 106 mg/dL — ABNORMAL HIGH (ref 70–99)
Potassium: 3.4 mmol/L — ABNORMAL LOW (ref 3.5–5.1)
Sodium: 137 mmol/L (ref 135–145)

## 2022-11-30 LAB — TROPONIN I (HIGH SENSITIVITY): Troponin I (High Sensitivity): 6 ng/L (ref <18)

## 2022-11-30 NOTE — ED Triage Notes (Signed)
The pt is c/o chest pain for over a week with dizziness chills lmp nov 7th

## 2022-12-01 LAB — TROPONIN I (HIGH SENSITIVITY): Troponin I (High Sensitivity): 6 ng/L (ref <18)

## 2022-12-01 LAB — HCG, SERUM, QUALITATIVE: Preg, Serum: NEGATIVE

## 2022-12-01 NOTE — ED Provider Notes (Signed)
[facility] AT [facility] Provider Note  CSN: [id number] Arrival date & time: 11/30/22 2243  Chief Complaint(s) Chest Pain  HPI Fredericka Giesecke Carolin Coy is a 37 y.o. female with no pertinent past medical history other than dyspepsia who presents to the emergency department for 2 hours of substernal chest tightness associated with flushing and cold sweats.  Initially short of breath due to the severity of the pain.  Pain was worse with supine position and improve by sitting up.  Nonexertional and nonradiating. She denies any associated nausea or vomiting.  No recent fevers or infections.  No coughing or shortness of breath.  No abdominal pain or diarrhea.  Chest pain and other symptoms have resolved since arriving.  No history of hypertension, hyperlipidemia, diabetes.  She is a non-smoker.  No family history of MIs below the age of 78.  Patient exercises regularly and eats a healthy diet.  No prior history of DVT/PE.  No prolonged immobilization or travel.  She is not on OCPs.  No known history of cancer.  Patient reports that  last week she had left shoulder pain that woke her up from sleep that was worse with range of motion.  This was unrelated issue from today's presentation.  The history is provided by the patient.    Past Medical History Past Medical History:  Diagnosis Date   Medical history non-contributory    Patient Active Problem List   Diagnosis Date Noted   Insomnia 05/13/2018   Dyspepsia 07/27/2015   Chronic pelvic pain in female 07/05/2015   Home Medication(s) Prior to Admission medications   Medication Sig Start Date End Date Taking? Authorizing Provider  acetaminophen (TYLENOL) 325 MG tablet Take 2 tablets (650 mg total) by mouth every 4 (four) hours as needed (for pain scale < 4). Patient not taking: Reported on 11/25/2022 03/04/19   Venora Maples, MD                                                                                                                                     Allergies Patient has no known allergies.  Review of Systems Review of Systems As noted in HPI  Physical Exam Vital Signs  I have reviewed the triage vital signs BP 128/80   Pulse 74   Temp 98 F (36.7 C)   Resp 18   Ht 5\' 6"  (1.676 m)   Wt 69.9 kg   LMP 11/14/2022 (Exact Date)   SpO2 100%   BMI 24.86 kg/m   Physical Exam Vitals reviewed.  Constitutional:      General: She is not in acute distress.    Appearance: She is well-developed. She is not diaphoretic.  HENT:     Head: Normocephalic and atraumatic.     Nose: Nose normal.  Eyes:     General: No scleral icterus.       Right eye: No discharge.  Left eye: No discharge.     Conjunctiva/sclera: Conjunctivae normal.     Pupils: Pupils are equal, round, and reactive to light.  Cardiovascular:     Rate and Rhythm: Normal rate and regular rhythm.     Heart sounds: No murmur heard.    No friction rub. No gallop.  Pulmonary:     Effort: Pulmonary effort is normal. No respiratory distress.     Breath sounds: Normal breath sounds. No stridor. No rales.  Abdominal:     General: There is no distension.     Palpations: Abdomen is soft.     Tenderness: There is no abdominal tenderness.  Musculoskeletal:        General: No tenderness.     Cervical back: Normal range of motion and neck supple.  Skin:    General: Skin is warm and dry.     Findings: No erythema or rash.  Neurological:     Mental Status: She is alert and oriented to person, place, and time.     ED Results and Treatments Labs (all labs ordered are listed, but only abnormal results are displayed) Labs Reviewed  BASIC METABOLIC PANEL - Abnormal; Notable for the following components:      Result Value   Potassium 3.4 (*)    Glucose, Bld 106 (*)    All other components within normal limits  CBC  HCG, SERUM, QUALITATIVE  TROPONIN I (HIGH SENSITIVITY)  TROPONIN I (HIGH SENSITIVITY)                                                                                                                          EKG  EKG Interpretation Date/Time:  Sunday December 01 2022 01:09:22 EST Ventricular Rate:  73 PR Interval:  164 QRS Duration:  98 QT Interval:  407 QTC Calculation: 449 R Axis:   86  Text Interpretation: Sinus rhythm Low voltage, precordial leads motion artifact resolved No acute changes Confirmed by Drema Pry [phone]) on 12/01/2022 2:04:21 AM       Radiology DG Chest 2 View  Result Date: 11/30/2022 CLINICAL DATA:  Chest pain for 2 weeks EXAM: CHEST - 2 VIEW COMPARISON:  04/14/2018 FINDINGS: Normal cardiomediastinal silhouette. No focal consolidation, pleural effusion, or pneumothorax. No displaced rib fractures. IMPRESSION: No acute cardiopulmonary disease. Electronically Signed   By: Minerva Fester M.D.   On: 11/30/2022 23:44    Medications Ordered in ED Medications - No data to display Procedures Procedures  (including critical care time) Medical Decision Making / ED Course   Medical Decision Making Amount and/or Complexity of Data Reviewed Labs: ordered. Decision-making details documented in ED Course. Radiology: ordered and independent interpretation performed. Decision-making details documented in ED Course. ECG/medicine tests: ordered and independent interpretation performed. Decision-making details documented in ED Course.    Substernal chest pain  DDX and work up listed below  Atypical for ACS.  Heart score less than 3.  EKG without acute ischemic changes or evidence of pericarditis.  Serial troponins negative x 2.  Unlikely ACS.  Presentation not classic for dissection or esophageal perforation.  Low pretest probability for pulmonary embolism and patient is PERC negative.  Chest x-ray without evidence of pneumonia, pneumothorax, pulmonary edema or pleural effusions.  CBC without leukocytosis or anemia. BMP with mild hypokalemia without other  electrolyte derangements or renal sufficiency. - no K+ repletion needed at time. Increase K+ containing foods recommended.  Favor GI related process i.e. reflux with possible esophageal spasm.   Given resolution of symptoms, unlikely pancreatitis.  Patient has had recent right upper quadrant ultrasound and CT without gallstones noted, making my suspicion for gallbladder disease low at this time.  Patient remained asymptomatic.      Final Clinical Impression(s) / ED Diagnoses Final diagnoses:  Nonspecific chest pain  Hypokalemia   The patient appears reasonably screened and/or stabilized for discharge and I doubt any other medical condition or other [facility] requiring further screening, evaluation, or treatment in the ED at this time. I have discussed the findings, Dx and Tx plan with the patient/family who expressed understanding and agree(s) with the plan. Discharge instructions discussed at length. The patient/family was given strict return precautions who verbalized understanding of the instructions. No further questions at time of discharge.  Disposition: Discharge  Condition: Good  ED Discharge Orders     None         Follow Up: Primary care provider  Call  to schedule an appointment for close follow up    This chart was dictated using voice recognition software.  Despite best efforts to proofread,  errors can occur which can change the documentation meaning.    Nira Conn, MD 12/01/22 [phone]

## 2022-12-02 ENCOUNTER — Encounter (HOSPITAL_COMMUNITY): Payer: Self-pay

## 2022-12-02 ENCOUNTER — Emergency Department (HOSPITAL_COMMUNITY)
Admission: EM | Admit: 2022-12-02 | Discharge: 2022-12-02 | Disposition: A | Discharge status: Home / Self Care | Payer: Self-pay | Attending: Emergency Medicine | Admitting: Emergency Medicine

## 2022-12-02 ENCOUNTER — Emergency Department (HOSPITAL_COMMUNITY): Payer: Self-pay

## 2022-12-02 ENCOUNTER — Other Ambulatory Visit: Payer: Self-pay

## 2022-12-02 DIAGNOSIS — R072 Precordial pain: Secondary | ICD-10-CM | POA: Insufficient documentation

## 2022-12-02 DIAGNOSIS — R1084 Generalized abdominal pain: Secondary | ICD-10-CM | POA: Insufficient documentation

## 2022-12-02 LAB — HEPATIC FUNCTION PANEL
ALT: 16 U/L (ref 0–44)
AST: 19 U/L (ref 15–41)
Albumin: 4.5 g/dL (ref 3.5–5.0)
Alkaline Phosphatase: 39 U/L (ref 38–126)
Bilirubin, Direct: 0.1 mg/dL (ref 0.0–0.2)
Indirect Bilirubin: 0.8 mg/dL (ref 0.3–0.9)
Total Bilirubin: 0.9 mg/dL (ref <1.2)
Total Protein: 8.4 g/dL — ABNORMAL HIGH (ref 6.5–8.1)

## 2022-12-02 LAB — BASIC METABOLIC PANEL
Anion gap: 8 (ref 5–15)
BUN: 13 mg/dL (ref 6–20)
CO2: 21 mmol/L — ABNORMAL LOW (ref 22–32)
Calcium: 9.3 mg/dL (ref 8.9–10.3)
Chloride: 106 mmol/L (ref 98–111)
Creatinine, Ser: 0.68 mg/dL (ref 0.44–1.00)
GFR, Estimated: >60 mL/min (ref >60)
Glucose, Bld: 102 mg/dL — ABNORMAL HIGH (ref 70–99)
Potassium: 3.7 mmol/L (ref 3.5–5.1)
Sodium: 135 mmol/L (ref 135–145)

## 2022-12-02 LAB — URINALYSIS, ROUTINE W REFLEX MICROSCOPIC
Bacteria, UA: NONE SEEN
Bilirubin Urine: NEGATIVE
Glucose, UA: NEGATIVE mg/dL
Ketones, ur: NEGATIVE mg/dL
Leukocytes,Ua: NEGATIVE
Nitrite: NEGATIVE
Protein, ur: NEGATIVE mg/dL
RBC / HPF: >50 RBC/hpf (ref 0–5)
Specific Gravity, Urine: 1.006 (ref 1.005–1.030)
pH: 9 — ABNORMAL HIGH (ref 5.0–8.0)

## 2022-12-02 LAB — LIPASE, BLOOD: Lipase: 52 U/L — ABNORMAL HIGH (ref 11–51)

## 2022-12-02 LAB — CBC
HCT: 39.1 % (ref 36.0–46.0)
Hemoglobin: 13.3 g/dL (ref 12.0–15.0)
MCH: 29 pg (ref 26.0–34.0)
MCHC: 34 g/dL (ref 30.0–36.0)
MCV: 85.2 fL (ref 80.0–100.0)
Platelets: 203 10*3/uL (ref 150–400)
RBC: 4.59 MIL/uL (ref 3.87–5.11)
RDW: 13.2 % (ref 11.5–15.5)
WBC: 4.1 10*3/uL (ref 4.0–10.5)
nRBC: 0 % (ref 0.0–0.2)

## 2022-12-02 LAB — TROPONIN I (HIGH SENSITIVITY)
Troponin I (High Sensitivity): 4 ng/L (ref <18)
Troponin I (High Sensitivity): 4 ng/L (ref <18)

## 2022-12-02 LAB — HCG, SERUM, QUALITATIVE: Preg, Serum: NEGATIVE

## 2022-12-02 MED ORDER — FAMOTIDINE 20 MG PO TABS: 20.0000 mg | ORAL_TABLET | Freq: Two times a day (BID) | ORAL | 0 refills | Status: AC

## 2022-12-02 MED ORDER — NAPROXEN 500 MG PO TABS: 500.0000 mg | ORAL_TABLET | Freq: Two times a day (BID) | ORAL | 0 refills | Status: DC

## 2022-12-02 MED ORDER — IOHEXOL 350 MG/ML SOLN
100.0000 mL | Freq: Once | INTRAVENOUS | Status: AC | PRN
  Administered 2022-12-02: 100 mL via INTRAVENOUS

## 2022-12-02 NOTE — ED Triage Notes (Signed)
C/o substernal chest tightness radiating through to back with dizziness, tingling to hands and feet, abd bloating, generalized abd pain, hematuria, and urinary frequency x2 weeks.  Denies sob.

## 2022-12-02 NOTE — Discharge Instructions (Addendum)
Haga un seguimiento con un proveedor de atencin primaria  Empieza a tomar la [employer] te he recetado.  Regrese si los sntomas son nuevos o Psychologist, prison and probation services

## 2022-12-02 NOTE — ED Provider Notes (Signed)
[facility] EMERGENCY DEPARTMENT AT [facility] Provider Note   CSN: [id number] Arrival date & time: 12/02/22  1336    History  Chief Complaint  Patient presents with   Chest Pain   Abdominal Pain    Jeanne Munoz is a 37 y.o. female here for evaluation of multiple complaints.  Patient states she has had diffuse abdominal pain worse to epigastric region over the last few months.  She was seen here in the emergency department in September.  She subsequently followed up with OB/GYN early November for pelvic pain.  She states she continues to have pain sometimes worse with spicy food intake however has been able to tolerate a normal diet without any difficulty.  Over the last 4 days she has had chest pain located to the center and right upper chest.  Worse with deep breathing.  States pain will occasionally radiate throughout her back.  She is currently on her menstrual cycle.  She is also states she has had intermittent hematuria over the last 6 months.  She denies chance of pregnancy.  Some intermittent nausea without vomiting.  No fever, headache, neck pain, back pain she denies any recent falls or injuries.  She does states she is stressed.  She has not been trying medication at home.  She denies any melena or bright of her rectum.  No chronic EtOH use, NSAID use.  History of PE or DVT.  No swelling to lower extremities.  No recent surgery, immobilization or malignancy  HPI     Home Medications Prior to Admission medications   Medication Sig Start Date End Date Taking? Authorizing Provider  famotidine (PEPCID) 20 MG tablet Take 1 tablet (20 mg total) by mouth 2 (two) times daily. 12/02/22  Yes Hagan Maltz A, PA-C  acetaminophen (TYLENOL) 325 MG tablet Take 2 tablets (650 mg total) by mouth every 4 (four) hours as needed (for pain scale < 4). Patient not taking: Reported on 11/25/2022 03/04/19   Venora Maples, MD      Allergies    Patient has no known  allergies.    Review of Systems   Review of Systems  Constitutional: Negative.   HENT: Negative.    Respiratory:  Positive for shortness of breath.   Cardiovascular:  Positive for chest pain.  Gastrointestinal:  Positive for abdominal pain and nausea. Negative for vomiting.  Genitourinary:  Positive for hematuria. Negative for decreased urine volume, pelvic pain, vaginal bleeding, vaginal discharge and vaginal pain.  Musculoskeletal: Negative.   Skin: Negative.   Neurological: Negative.   All other systems reviewed and are negative.  Physical Exam Updated Vital Signs BP 120/68   Pulse 81   Temp 98 F (36.7 C)   Resp 18   Wt 69 kg   LMP 11/14/2022 (Exact Date)   SpO2 100%   BMI 24.55 kg/m  Physical Exam Vitals and nursing note reviewed.  Constitutional:      General: She is not in acute distress.    Appearance: She is well-developed. She is not ill-appearing, toxic-appearing or diaphoretic.  HENT:     Head: Atraumatic.  Eyes:     Pupils: Pupils are equal, round, and reactive to light.  Cardiovascular:     Rate and Rhythm: Normal rate.     Pulses:          Radial pulses are 2+ on the right side and 2+ on the left side.       Dorsalis pedis pulses are 2+  on the right side and 2+ on the left side.     Heart sounds: Normal heart sounds.  Pulmonary:     Effort: Pulmonary effort is normal. No respiratory distress.     Breath sounds: Normal breath sounds.  Chest:     Chest wall: Tenderness present.  Abdominal:     General: Bowel sounds are normal. There is no distension or abdominal bruit.     Palpations: Abdomen is soft. There is no fluid wave, hepatomegaly, splenomegaly or mass.     Tenderness: There is abdominal tenderness. There is no guarding or rebound.    Musculoskeletal:        General: Normal range of motion.     Cervical back: Normal range of motion.     Right lower leg: No tenderness. No edema.     Left lower leg: No tenderness. No edema.     Comments: No  bony tenderness, full range of motion, compartment soft, nontender calves bilaterally  Skin:    General: Skin is warm and dry.     Capillary Refill: Capillary refill takes less than 2 seconds.     Comments: No obvious rashes or lesions on exposed skin  Neurological:     General: No focal deficit present.     Mental Status: She is alert.  Psychiatric:        Mood and Affect: Mood normal.        Thought Content: Thought content normal.     Comments: Appears anxious    ED Results / Procedures / Treatments   Labs (all labs ordered are listed, but only abnormal results are displayed) Labs Reviewed  BASIC METABOLIC PANEL - Abnormal; Notable for the following components:      Result Value   CO2 21 (*)    Glucose, Bld 102 (*)    All other components within normal limits  LIPASE, BLOOD - Abnormal; Notable for the following components:   Lipase 52 (*)    All other components within normal limits  URINALYSIS, ROUTINE W REFLEX MICROSCOPIC - Abnormal; Notable for the following components:   Color, Urine STRAW (*)    pH 9.0 (*)    Hgb urine dipstick LARGE (*)    All other components within normal limits  HEPATIC FUNCTION PANEL - Abnormal; Notable for the following components:   Total Protein 8.4 (*)    All other components within normal limits  CBC  HCG, SERUM, QUALITATIVE  TROPONIN I (HIGH SENSITIVITY)  TROPONIN I (HIGH SENSITIVITY)    EKG None  Radiology CT Angio Chest PE W and/or Wo Contrast  Result Date: 12/02/2022 CLINICAL DATA:  SUBSTERNAL CHEST PAIN. CONCERN FOR ACUTE PULMONARY EMBOLISM. ADDITIONAL HISTORY OF HEMATURIA AND URINARY FREQUENCY. EXAM: CT ANGIOGRAPHY CHEST CT ABDOMEN AND PELVIS WITH CONTRAST TECHNIQUE: Multidetector CT imaging of the chest was performed using the standard protocol during bolus administration of intravenous contrast. Multiplanar CT image reconstructions and MIPs were obtained to evaluate the vascular anatomy. Multidetector CT imaging of the abdomen  and pelvis was performed using the standard protocol during bolus administration of intravenous contrast. RADIATION DOSE REDUCTION: This exam was performed according to the departmental dose-optimization program which includes automated exposure control, adjustment of the mA and/or kV according to patient size and/or use of iterative reconstruction technique. CONTRAST:  OMNIPAQUE IOHEXOL 350 MG/ML SOLN COMPARISON:  CT ABDOMEN 10/03/2022 FINDINGS: CTA CHEST FINDINGS Cardiovascular: No filling defects within the pulmonary arteries to suggest acute pulmonary embolism. No significant vascular findings. Normal heart  size. No pericardial effusion. Mediastinum/Nodes: No axillary or supraclavicular adenopathy. No mediastinal or hilar adenopathy. No pericardial fluid. Esophagus normal. Lungs/Pleura: No pulmonary infarction. No pneumonia. No pleural fluid. No pneumothorax Musculoskeletal: No aggressive osseous lesion. Review of the MIP images confirms the above findings. CT ABDOMEN and PELVIS FINDINGS Hepatobiliary: No focal hepatic lesion. Normal gallbladder. No biliary duct dilatation. Common bile duct is normal. Pancreas: Pancreas is normal. No ductal dilatation. No pancreatic inflammation. Spleen: Normal spleen Adrenals/urinary tract: Adrenal glands and kidneys are normal. The ureters and bladder normal. Stomach/Bowel: Stomach, small bowel, appendix, and cecum are normal. The colon and rectosigmoid colon are normal. Vascular/Lymphatic: Abdominal aorta is normal caliber. No periportal or retroperitoneal adenopathy. No pelvic adenopathy. Reproductive: Uterus normal. LEFT ovary is enlarged. There is intermediate density cystic lesion within the LEFT ovary measuring 4.9 x 2.7 4.8 cm (image 65/2). Cystic lesion is not significant changed from comparison CT. The RIGHT ovary measures 2.7 cm on image 68) Other: None Musculoskeletal: No aggressive osseous lesion Review of the MIP images confirms the above findings.  IMPRESSION: CHEST: 1. No evidence acute pulmonary embolism. 2. No acute pulmonary findings. PELVIS: 1. No acute findings in the abdomen pelvis. No explanation for hematuria. 2. Intermediate density 4.7 cm cystic lesion in the LEFT ovary is favored hemorrhagic cyst. No follow-up imaging is recommended in this age group. Reference: JACR 2020 Feb;17(2):248-254 Electronically Signed   By: Genevive Bi M.D.   On: 12/02/2022 17:47   CT ABDOMEN PELVIS W CONTRAST  Result Date: 12/02/2022 CLINICAL DATA:  SUBSTERNAL CHEST PAIN. CONCERN FOR ACUTE PULMONARY EMBOLISM. ADDITIONAL HISTORY OF HEMATURIA AND URINARY FREQUENCY. EXAM: CT ANGIOGRAPHY CHEST CT ABDOMEN AND PELVIS WITH CONTRAST TECHNIQUE: Multidetector CT imaging of the chest was performed using the standard protocol during bolus administration of intravenous contrast. Multiplanar CT image reconstructions and MIPs were obtained to evaluate the vascular anatomy. Multidetector CT imaging of the abdomen and pelvis was performed using the standard protocol during bolus administration of intravenous contrast. RADIATION DOSE REDUCTION: This exam was performed according to the departmental dose-optimization program which includes automated exposure control, adjustment of the mA and/or kV according to patient size and/or use of iterative reconstruction technique. CONTRAST:  OMNIPAQUE IOHEXOL 350 MG/ML SOLN COMPARISON:  CT ABDOMEN 10/03/2022 FINDINGS: CTA CHEST FINDINGS Cardiovascular: No filling defects within the pulmonary arteries to suggest acute pulmonary embolism. No significant vascular findings. Normal heart size. No pericardial effusion. Mediastinum/Nodes: No axillary or supraclavicular adenopathy. No mediastinal or hilar adenopathy. No pericardial fluid. Esophagus normal. Lungs/Pleura: No pulmonary infarction. No pneumonia. No pleural fluid. No pneumothorax Musculoskeletal: No aggressive osseous lesion. Review of the MIP images confirms the above findings.  CT ABDOMEN and PELVIS FINDINGS Hepatobiliary: No focal hepatic lesion. Normal gallbladder. No biliary duct dilatation. Common bile duct is normal. Pancreas: Pancreas is normal. No ductal dilatation. No pancreatic inflammation. Spleen: Normal spleen Adrenals/urinary tract: Adrenal glands and kidneys are normal. The ureters and bladder normal. Stomach/Bowel: Stomach, small bowel, appendix, and cecum are normal. The colon and rectosigmoid colon are normal. Vascular/Lymphatic: Abdominal aorta is normal caliber. No periportal or retroperitoneal adenopathy. No pelvic adenopathy. Reproductive: Uterus normal. LEFT ovary is enlarged. There is intermediate density cystic lesion within the LEFT ovary measuring 4.9 x 2.7 4.8 cm (image 65/2). Cystic lesion is not significant changed from comparison CT. The RIGHT ovary measures 2.7 cm on image 68) Other: None Musculoskeletal: No aggressive osseous lesion Review of the MIP images confirms the above findings. IMPRESSION: CHEST: 1. No evidence acute pulmonary  embolism. 2. No acute pulmonary findings. PELVIS: 1. No acute findings in the abdomen pelvis. No explanation for hematuria. 2. Intermediate density 4.7 cm cystic lesion in the LEFT ovary is favored hemorrhagic cyst. No follow-up imaging is recommended in this age group. Reference: JACR 2020 Feb;17(2):248-254 Electronically Signed   By: Genevive Bi M.D.   On: 12/02/2022 17:47   DG Chest 2 View  Result Date: 12/02/2022 CLINICAL DATA:  Chest pain radiating to back.  Dizziness. EXAM: CHEST - 2 VIEW COMPARISON:  11/30/2022 FINDINGS: The heart size and mediastinal contours are within normal limits. Both lungs are clear. The visualized skeletal structures are unremarkable. IMPRESSION: No active cardiopulmonary disease. Electronically Signed   By: Danae Orleans M.D.   On: 12/02/2022 15:20   DG Chest 2 View  Result Date: 11/30/2022 CLINICAL DATA:  Chest pain for 2 weeks EXAM: CHEST - 2 VIEW COMPARISON:  04/14/2018  FINDINGS: Normal cardiomediastinal silhouette. No focal consolidation, pleural effusion, or pneumothorax. No displaced rib fractures. IMPRESSION: No acute cardiopulmonary disease. Electronically Signed   By: Minerva Fester M.D.   On: 11/30/2022 23:44    Procedures Procedures    Medications Ordered in ED Medications  iohexol (OMNIPAQUE) 350 MG/ML injection 100 mL (100 mLs Intravenous Contrast Given 12/02/22 1701)    ED Course/ Medical Decision Making/ A&P   37 year old here for evaluation of multiple complaints.  She is afebrile, nonseptic, not ill-appearing.  She is neurovascularly intact.  She is no clinical evidence of VTE on exam.  Patient with chest pain and abdominal pain.  Sounds like her abdominal pain has been longstanding since September.  She continues to have symptoms.  She has had a few days of central and right sided chest pain which radiates into her back.  No recent falls or injuries.  No prior history of PE or DVT.  No recent illnesses.  She is eating and drinking without difficulty.  Does have some mild tenderness to her chest wall.  She appears mildly anxious.  She was seen 2 days ago for her chest pain as well as in September for her abdominal pain. Will plan on labs, imaging, reassess  Labs and imaging personally viewed and interpreted:  CBC without leukocytosis 9 metabolic panel without significant 9 9 lipase 52, has chronic elevation UA with blood however she is currently on menstrual cycle Pending delta negative EKG without ischemic changes Chest x-ray that significant abnormality CTA chest without significant abnormality CT abdomen pelvis with possible left adnexal cyst, she is nontender to her left her pain is more epigastric region and low suspicion for torsion.  Patient reassessed.  We discussed labs and imaging.  Could possibly have reflux component given her epigastric pain and into her chest.  At this time I low suspicion for acute ACS, PE, dissection,  pneumomediastinum, bacterial infectious process, symptomatic cholelithiasis, choledocholithiasis, obstruction, perforation, PID, intermittent/persistent torsion, TOA,   The patient has been appropriately medically screened and/or stabilized in the ED. I have low suspicion for any other emergent medical condition which would require further screening, evaluation or treatment in the ED or require inpatient management.  Patient is hemodynamically stable and in no acute distress.  Patient able to ambulate in department prior to ED.  Evaluation does not show acute pathology that would require ongoing or additional emergent interventions while in the emergency department or further inpatient treatment.  I have discussed the diagnosis with the patient and answered all questions.  Pain is been managed while in the emergency department  and patient has no further complaints prior to discharge.  Patient is comfortable with plan discussed in room and is stable for discharge at this time.  I have discussed strict return precautions for returning to the emergency department.  Patient was encouraged to follow-up with PCP/specialist refer to at discharge.                                 Medical Decision Making Amount and/or Complexity of Data Reviewed Independent Historian: spouse and friend External Data Reviewed: labs, radiology, ECG and notes. Labs: ordered. Decision-making details documented in ED Course. Radiology: ordered and independent interpretation performed. Decision-making details documented in ED Course. ECG/medicine tests: ordered and independent interpretation performed. Decision-making details documented in ED Course.  Risk OTC drugs. Prescription drug management. Parenteral controlled substances. Decision regarding hospitalization. Diagnosis or treatment significantly limited by social determinants of health.         Final Clinical Impression(s) / ED Diagnoses Final diagnoses:   Generalized abdominal pain  Precordial pain    Rx / DC Orders ED Discharge Orders          Ordered    naproxen (NAPROSYN) 500 MG tablet  2 times daily,   Status:  Discontinued        12/02/22 1837    famotidine (PEPCID) 20 MG tablet  2 times daily        12/02/22 1838              Linwood Dibbles, PA-C 12/02/22 1849    Gwyneth Sprout, MD 12/02/22 2311

## 2022-12-10 ENCOUNTER — Telehealth: Payer: Self-pay | Admitting: *Deleted

## 2022-12-10 NOTE — Telephone Encounter (Signed)
From Dr Berton Lan   She possibly has a small endometrioma and the lining of her uterus is thick. This would be expected if she is about to get her period (which based on her LMP of 11/7 is probably true)  They are unlikely to be the source of her abdominal pain, but if she would like to go over everything in detail she can schedule a virtual appt w/ me   Attempt to contact pt to make aware of recommendation for appt.  LVM advising pt to call office if she would like appt.

## 2023-01-30 ENCOUNTER — Telehealth: Payer: Self-pay | Admitting: Obstetrics and Gynecology

## 2023-01-30 DIAGNOSIS — N83202 Unspecified ovarian cyst, left side: Secondary | ICD-10-CM

## 2023-01-30 DIAGNOSIS — Z603 Acculturation difficulty: Secondary | ICD-10-CM

## 2023-01-30 DIAGNOSIS — Z758 Other problems related to medical facilities and other health care: Secondary | ICD-10-CM

## 2023-01-30 NOTE — Progress Notes (Signed)
TELEHEALTH GYNECOLOGY VISIT ENCOUNTER NOTE  Provider location: [facility]   Patient location: Home  I connected with Jeanne Munoz on 01/30/23 at  4:10 PM EST by MyChart audiovisual encounter   I discussed the limitations, risks, security and privacy concerns of performing an evaluation and management service by telephone and the availability of in person appointments. I also discussed with the patient that there may be a patient responsible charge related to this service. The patient expressed understanding and agreed to proceed.   History:  Jeanne Munoz is a 38 y.o. [id number] presenting for follow up of ovarian cyst.   Saw patient in November for 2 months of intermittent abdominal/pelvic pain that seemed most GI in etiology (associated with foods, mood) as well as spotting after IC/vigorous exercise attributable to cervical ectropion. Her pap & GC/CT/trich testing was normal. We discussed low FODMAP diet and PCP follow up. We also obtained pelvic US to r/o structural etiology of her pain. US revealed thickened endometrium (but c/w with where patient was in menstrual cycle) and 4.6cm cyst in the L ovary with low level internal echos c/f endometrioma. Radiologist suggested follow up US In 6-12 weeks.  TODAY, she reports that her pain has improved with diet changes but has not completely resolved. Avoids certain foods, esp spicy foods. She has PCP appt scheduled 1/30. She presents to review her ultrasound results.    Past Medical History:  Diagnosis Date   Medical history non-contributory    Past Surgical History:  Procedure Laterality Date   NO PAST SURGERIES     The following portions of the patient's history were reviewed and updated as appropriate: allergies, current medications, past family history, past medical history, past social history, past surgical history and problem list.   Health Maintenance:  Normal pap and negative HRHPV on  11/25/22  Review of Systems:  Pertinent items noted in HPI and remainder of comprehensive ROS otherwise negative.  Physical Exam:   General:  Alert, oriented and cooperative.   Mental Status: Normal mood and affect perceived. Normal judgment and thought content.  Physical exam deferred due to nature of the encounter  Labs and Imaging No results found for this or any previous visit (from the past 2 weeks). No results found.    Assessment and Plan:     37yo with abdominal pain of suspected GI etiology and incidentally diagnosed left ovarian cyst  Cyst of left ovary Reviewed small cyst is unlikely to be source of patient's pain Reviewed focused ddx is hemorrhagic cyst vs endometrioma  Will repeat US in 2-4 weeks and schedule follow up appt to review results Discussed tentative plans - if resolved, no additional [place] needed. If stable, surveillance with [place] in 1 year versus surgical excision. If increasing in size, would recommend surgical excision  -     US PELVIC COMPLETE WITH TRANSVAGINAL; Future  Language barrier Carollee Sires and counseled with telephone interpreter from [employer]   I discussed the assessment and treatment plan with the patient. The patient was provided an opportunity to ask questions and all were answered. The patient agreed with the plan and demonstrated an understanding of the instructions.   The patient was advised to call back or seek an in-person evaluation/go to the ED if the symptoms worsen or if the condition fails to improve as anticipated.  I provided 22 minutes of non-face-to-face time during this encounter.  Lennart Pall, MD [facility] for [employer], [facility]

## 2023-02-20 ENCOUNTER — Ambulatory Visit (HOSPITAL_BASED_OUTPATIENT_CLINIC_OR_DEPARTMENT_OTHER): Admission: RE | Admit: 2023-02-20 | Payer: Self-pay | Source: Ambulatory Visit

## 2023-03-10 ENCOUNTER — Ambulatory Visit: Payer: Self-pay | Admitting: Obstetrics and Gynecology

## 2023-03-11 ENCOUNTER — Ambulatory Visit (HOSPITAL_BASED_OUTPATIENT_CLINIC_OR_DEPARTMENT_OTHER)
Admission: RE | Admit: 2023-03-11 | Discharge: 2023-03-11 | Disposition: A | Discharge status: Home / Self Care | Payer: Self-pay | Source: Ambulatory Visit | Attending: Obstetrics and Gynecology | Admitting: Obstetrics and Gynecology

## 2023-03-11 DIAGNOSIS — N83202 Unspecified ovarian cyst, left side: Secondary | ICD-10-CM | POA: Insufficient documentation

## 2023-03-24 ENCOUNTER — Encounter: Payer: Self-pay | Admitting: Obstetrics and Gynecology

## 2023-03-24 ENCOUNTER — Ambulatory Visit: Payer: Self-pay | Admitting: Obstetrics and Gynecology

## 2023-03-24 VITALS — BP 112/74 | HR 69 | Ht 66.0 in | Wt 154.0 lb

## 2023-03-24 DIAGNOSIS — N80129 Deep endometriosis of ovary, unspecified ovary: Secondary | ICD-10-CM | POA: Diagnosis not present

## 2023-03-24 NOTE — Progress Notes (Signed)
 38 y.o. GYN presents for [place] results Follow Up.

## 2023-03-25 NOTE — Progress Notes (Signed)
   RETURN GYNECOLOGY VISIT  Subjective:  Jeanne Munoz is a 38 y.o. G3P3003 with LMP 03/07/23 presenting for follow up of ovarian cyst  Initially seen 4 months ago for 4 months of intermittent abdominal/pelvic pain with associated bloating, painful BM. Exam non focal. Pelvic US 11/28/22 with 4.6cm L ovarian cyst with low level internal echos thought to be an endometrioma as well as thickened EL. Follow up US was recommended in 6-12 weeks. Repeat US 03/11/23 showed resolution of thickened endometrium (EL 11mm) and an overall stable appearance of the L ovarian cyst (4.4 x 3.6 x 5.6cm with uniform echogenicity) c/w probable endometrioma, but radiologist recommends MRI for further characterization.   Today, she presents to review her results. She reports her abdominal/pelvic pain has largely resolved after making dietary changes. She occasionally has LLQ discomfort but states that it is very mild and usually doesn't bother her.   Objective:   Vitals:   03/24/23 1556  BP: 112/74  Pulse: 69  Weight: 154 lb (69.9 kg)  Height: 5\' 6"  (1.676 m)   General:  Alert, oriented and cooperative. Patient is in no acute distress.  Skin: Skin is warm and dry. No rash noted.   Cardiovascular: Normal heart rate noted  Respiratory: Normal respiratory effort, no problems with respiration noted    Assessment and Plan:  Jeanne Munoz is a 38 y.o. 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 Endometrioma of ovary (Primary) Based on history today, this is largely asymptomatic and can likely be observed. Pt would prefer to avoid surgery if possible. Will obtain MRI based on radiologist recommendations If endometrioma is confirmed, plan for surveillance [place] in 12 & 24 months - MR PELVIS W WO CONTRAST; Future  Separately, we briefly review patient's questions about menopause transition and normal changes in people's cycles that can occur with age. Recommended she calls for cycles <21d, periods lasting 8+ days, heavy  bleeding, or intermenstrual bleeding.   Return for pelvic MRI & then ultrasound in 1 year.  No future appointments.  Lennart Pall, MD

## 2023-05-06 ENCOUNTER — Encounter: Payer: Self-pay | Admitting: Obstetrics and Gynecology

## 2023-05-08 ENCOUNTER — Ambulatory Visit
Admission: RE | Admit: 2023-05-08 | Discharge: 2023-05-08 | Disposition: A | Discharge status: Home / Self Care | Source: Ambulatory Visit | Attending: Obstetrics and Gynecology | Admitting: Obstetrics and Gynecology

## 2023-05-08 DIAGNOSIS — N80129 Deep endometriosis of ovary, unspecified ovary: Secondary | ICD-10-CM

## 2023-05-08 MED ORDER — GADOPICLENOL 0.5 MMOL/ML IV SOLN
7.0000 mL | Freq: Once | INTRAVENOUS | Status: AC | PRN
  Administered 2023-05-08: 7 mL via INTRAVENOUS

## 2023-05-19 ENCOUNTER — Encounter: Payer: Self-pay | Admitting: Obstetrics and Gynecology
